# Patient Record
Sex: Female | Born: 1970 | Hispanic: Yes | Smoking: Never smoker
Health system: Southern US, Community
[De-identification: ages and names within clinical notes are randomized; demographics above are authoritative.]

## PROBLEM LIST (undated history)

## (undated) DIAGNOSIS — F329 Major depressive disorder, single episode, unspecified: Secondary | ICD-10-CM

## (undated) DIAGNOSIS — F32A Depression, unspecified: Secondary | ICD-10-CM

## (undated) DIAGNOSIS — F419 Anxiety disorder, unspecified: Secondary | ICD-10-CM

## (undated) DIAGNOSIS — G47419 Narcolepsy without cataplexy: Secondary | ICD-10-CM

## (undated) DIAGNOSIS — F909 Attention-deficit hyperactivity disorder, unspecified type: Secondary | ICD-10-CM

## (undated) HISTORY — PX: COCHLEAR IMPLANT: SUR684

## (undated) HISTORY — DX: Attention-deficit hyperactivity disorder, unspecified type: F90.9

## (undated) HISTORY — DX: Depression, unspecified: F32.A

## (undated) HISTORY — DX: Anxiety disorder, unspecified: F41.9

## (undated) HISTORY — DX: Major depressive disorder, single episode, unspecified: F32.9

## (undated) HISTORY — PX: BREAST BIOPSY: SHX20

---

## 2005-12-28 ENCOUNTER — Encounter: Admission: RE | Admit: 2005-12-28 | Discharge: 2005-12-28 | Payer: Self-pay | Admitting: Obstetrics & Gynecology

## 2006-01-02 ENCOUNTER — Encounter: Admission: RE | Admit: 2006-01-02 | Discharge: 2006-01-02 | Payer: Self-pay | Admitting: Interventional Radiology

## 2008-05-23 ENCOUNTER — Emergency Department (HOSPITAL_BASED_OUTPATIENT_CLINIC_OR_DEPARTMENT_OTHER): Admission: EM | Admit: 2008-05-23 | Discharge: 2008-05-23 | Payer: Self-pay | Admitting: Emergency Medicine

## 2008-05-23 ENCOUNTER — Ambulatory Visit: Payer: Self-pay | Admitting: Diagnostic Radiology

## 2009-07-07 ENCOUNTER — Emergency Department (HOSPITAL_BASED_OUTPATIENT_CLINIC_OR_DEPARTMENT_OTHER): Admission: EM | Admit: 2009-07-07 | Discharge: 2009-07-07 | Payer: Self-pay | Admitting: Emergency Medicine

## 2009-07-07 ENCOUNTER — Ambulatory Visit: Payer: Self-pay | Admitting: Diagnostic Radiology

## 2010-09-04 LAB — BASIC METABOLIC PANEL
Calcium: 8.6 mg/dL (ref 8.4–10.5)
Chloride: 105 mEq/L (ref 96–112)
Creatinine, Ser: 0.7 mg/dL (ref 0.4–1.2)
GFR calc Af Amer: >60 mL/min (ref >60)

## 2010-09-04 LAB — POCT CARDIAC MARKERS
Myoglobin, poc: 30 ng/mL (ref 12–200)
Troponin i, poc: <0.05 ng/mL (ref 0.00–0.09)

## 2010-09-04 LAB — CBC
MCHC: 34.3 g/dL (ref 30.0–36.0)
Platelets: 169 10*3/uL (ref 150–400)
RBC: 3.97 MIL/uL (ref 3.87–5.11)
RDW: 12.7 % (ref 11.5–15.5)
WBC: 3.9 10*3/uL — ABNORMAL LOW (ref 4.0–10.5)

## 2010-09-04 LAB — DIFFERENTIAL
Basophils Absolute: 0 10*3/uL (ref 0.0–0.1)
Eosinophils Absolute: 0.1 10*3/uL (ref 0.0–0.7)
Eosinophils Relative: 1 % (ref 0–5)
Lymphs Abs: 1.3 10*3/uL (ref 0.7–4.0)
Monocytes Relative: 7 % (ref 3–12)
Neutro Abs: 2.2 10*3/uL (ref 1.7–7.7)
Neutrophils Relative %: 57 % (ref 43–77)

## 2011-03-23 ENCOUNTER — Other Ambulatory Visit: Payer: Self-pay | Admitting: Obstetrics & Gynecology

## 2011-03-24 LAB — CBC
MCHC: 33.7 g/dL (ref 30.0–36.0)
RBC: 4.25 MIL/uL (ref 3.87–5.11)
RDW: 12.7 % (ref 11.5–15.5)
WBC: 4.1 10*3/uL (ref 4.0–10.5)

## 2011-03-24 LAB — DIFFERENTIAL
Basophils Absolute: 0 10*3/uL (ref 0.0–0.1)
Basophils Relative: 1 % (ref 0–1)
Eosinophils Absolute: 0.1 10*3/uL (ref 0.0–0.7)
Eosinophils Relative: 2 % (ref 0–5)
Monocytes Absolute: 0.2 10*3/uL (ref 0.1–1.0)
Monocytes Relative: 6 % (ref 3–12)
Neutrophils Relative %: 58 % (ref 43–77)

## 2011-03-24 LAB — COMPREHENSIVE METABOLIC PANEL
AST: 22 U/L (ref 0–37)
Albumin: 4.4 g/dL (ref 3.5–5.2)
BUN: 11 mg/dL (ref 6–23)
Creatinine, Ser: 0.7 mg/dL (ref 0.4–1.2)
Glucose, Bld: 77 mg/dL (ref 70–99)
Potassium: 3.8 mEq/L (ref 3.5–5.1)
Sodium: 142 mEq/L (ref 135–145)
Total Bilirubin: 0.7 mg/dL (ref 0.3–1.2)
Total Protein: 7.3 g/dL (ref 6.0–8.3)

## 2011-03-24 LAB — POCT CARDIAC MARKERS

## 2012-05-17 ENCOUNTER — Emergency Department (HOSPITAL_BASED_OUTPATIENT_CLINIC_OR_DEPARTMENT_OTHER)

## 2012-05-17 ENCOUNTER — Encounter (HOSPITAL_BASED_OUTPATIENT_CLINIC_OR_DEPARTMENT_OTHER): Payer: Self-pay | Admitting: *Deleted

## 2012-05-17 ENCOUNTER — Emergency Department (HOSPITAL_BASED_OUTPATIENT_CLINIC_OR_DEPARTMENT_OTHER)
Admission: EM | Admit: 2012-05-17 | Discharge: 2012-05-17 | Disposition: A | Discharge status: Home / Self Care | Attending: Emergency Medicine | Admitting: Emergency Medicine

## 2012-05-17 DIAGNOSIS — G47419 Narcolepsy without cataplexy: Secondary | ICD-10-CM | POA: Insufficient documentation

## 2012-05-17 DIAGNOSIS — R059 Cough, unspecified: Secondary | ICD-10-CM

## 2012-05-17 DIAGNOSIS — R05 Cough: Secondary | ICD-10-CM

## 2012-05-17 DIAGNOSIS — Z79899 Other long term (current) drug therapy: Secondary | ICD-10-CM | POA: Insufficient documentation

## 2012-05-17 DIAGNOSIS — J040 Acute laryngitis: Secondary | ICD-10-CM

## 2012-05-17 DIAGNOSIS — R509 Fever, unspecified: Secondary | ICD-10-CM | POA: Insufficient documentation

## 2012-05-17 HISTORY — DX: Narcolepsy without cataplexy: G47.419

## 2012-05-17 MED ORDER — BENZONATATE 100 MG PO CAPS: 100.0000 mg | ORAL_CAPSULE | Freq: Three times a day (TID) | ORAL | Status: DC

## 2012-05-17 NOTE — ED Notes (Addendum)
Pt c/o cough, SOB and "tickle in her throat" x 3 days, worse at night. Pt also c/o slight fever. Pt sts cough has been productive of green sputum.

## 2012-05-17 NOTE — ED Provider Notes (Signed)
History     CSN: [id number]  Arrival date & time 05/17/12  6962   First MD Initiated Contact with Patient 05/17/12 [phone]      Chief Complaint  Patient presents with  . Cough    (Consider location/radiation/quality/duration/timing/severity/associated sxs/prior treatment) HPI Pt presents with c/o cough and laryngitis.  Symptoms began several days ago.  Low grade fever.  Pt states she feels a tickle in her throat and it makes her cough.  Cough is nonproductive.  No vomiting.  Was seen by her PMD and given rx for albuterol inhaler and prednisone which has given some relief.  She has been having difficulty sleeping due to the cough.  There are no other associated systemic symptoms, there are no other alleviating or modifying factors.   Past Medical History  Diagnosis Date  . Narcolepsy     Past Surgical History  Procedure Date  . Bladder repair w/ cesarean section     No family history on file.  History  Substance Use Topics  . Smoking status: Never Smoker   . Smokeless tobacco: Not on file  . Alcohol Use: Yes    OB History    Grav Para Term Preterm Abortions TAB SAB Ect Mult Living                  Review of Systems ROS reviewed and all otherwise negative except for mentioned in HPI  Allergies  Ibuprofen and Latex  Home Medications   Current Outpatient Rx  Name  Route  Sig  Dispense  Refill  . ALBUTEROL SULFATE HFA 108 (90 BASE) MCG/ACT IN AERS   Inhalation   Inhale 2 puffs into the lungs every 6 (six) hours as needed.         Marland Kitchen PREDNISONE 10 MG PO TABS   Oral   Take 10 mg by mouth daily.         Marland Kitchen BENZONATATE 100 MG PO CAPS   Oral   Take 1 capsule (100 mg total) by mouth every 8 (eight) hours.   21 capsule   0     BP 126/72  Pulse 73  Temp 98.6 F (37 C) (Oral)  Resp 18  Ht 5\' 2"  (1.575 m)  Wt 150 lb (68.04 kg)  BMI 27.44 kg/m2  SpO2 100%  LMP 05/14/2012 Vitals reviewed Physical Exam Physical Examination: General appearance - alert,  well appearing, and in no distress Mental status - alert, oriented to person, place, and time Eyes - no conjunctival injection, no scleral icterus Mouth - mucous membranes moist, pharynx normal without lesions Chest - clear to auscultation, no wheezes, rales or rhonchi, symmetric air entry Heart - normal rate, regular rhythm, normal S1, S2, no murmurs, rubs, clicks or gallops Abdomen - soft, nontender, nondistended, no masses or organomegaly Extremities - peripheral pulses normal, no pedal edema, no clubbing or cyanosis Skin - normal coloration and turgor, no rashes  ED Course  Procedures (including critical care time)  Labs Reviewed - No data to display Dg Chest 2 View  05/17/2012  *RADIOLOGY REPORT*  Clinical Data: Cough, congestion  CHEST - 2 VIEW  Comparison: 07/07/2009  Findings: Lungs are clear. No pleural effusion or pneumothorax.  Cardiomediastinal silhouette is within normal limits.  Visualized osseous structures are within normal limits.  IMPRESSION: No evidence of acute cardiopulmonary disease.   Original Report Authenticated By: Charline Bills, M.D.      1. Cough   2. Laryngitis       MDM  Pt presenting with c/o cough and laryngitis.  CXR reassuring- images reviewed by me as well.  Pt is overall nontoxic and well hydrated in appearance.  Taking albuterol and prednisone as prescribed.  Will give rx for tessalon to help with cough.   Discharged with strict return precautions.  Pt agreeable with plan.        Ethelda Chick, MD 05/17/12 [phone]

## 2013-09-15 ENCOUNTER — Other Ambulatory Visit (HOSPITAL_BASED_OUTPATIENT_CLINIC_OR_DEPARTMENT_OTHER): Payer: Self-pay | Admitting: Nurse Practitioner

## 2013-09-15 DIAGNOSIS — Z1231 Encounter for screening mammogram for malignant neoplasm of breast: Secondary | ICD-10-CM

## 2013-09-22 ENCOUNTER — Ambulatory Visit (HOSPITAL_BASED_OUTPATIENT_CLINIC_OR_DEPARTMENT_OTHER)
Admission: RE | Admit: 2013-09-22 | Discharge: 2013-09-22 | Disposition: A | Discharge status: Home / Self Care | Payer: BC Managed Care – PPO | Source: Ambulatory Visit | Attending: Nurse Practitioner | Admitting: Nurse Practitioner

## 2013-09-22 DIAGNOSIS — Z1231 Encounter for screening mammogram for malignant neoplasm of breast: Secondary | ICD-10-CM | POA: Insufficient documentation

## 2013-11-17 ENCOUNTER — Ambulatory Visit: Payer: Self-pay | Admitting: Obstetrics & Gynecology

## 2014-06-15 ENCOUNTER — Encounter: Payer: Self-pay | Admitting: *Deleted

## 2014-06-16 ENCOUNTER — Encounter: Payer: Self-pay | Admitting: Obstetrics & Gynecology

## 2015-03-29 ENCOUNTER — Other Ambulatory Visit (HOSPITAL_BASED_OUTPATIENT_CLINIC_OR_DEPARTMENT_OTHER): Payer: Self-pay | Admitting: Family Medicine

## 2015-03-29 DIAGNOSIS — Z1231 Encounter for screening mammogram for malignant neoplasm of breast: Secondary | ICD-10-CM

## 2015-04-06 ENCOUNTER — Ambulatory Visit (HOSPITAL_BASED_OUTPATIENT_CLINIC_OR_DEPARTMENT_OTHER)
Admission: RE | Admit: 2015-04-06 | Discharge: 2015-04-06 | Disposition: A | Discharge status: Home / Self Care | Payer: No Typology Code available for payment source | Source: Ambulatory Visit | Attending: Family Medicine | Admitting: Family Medicine

## 2015-04-06 ENCOUNTER — Other Ambulatory Visit (HOSPITAL_BASED_OUTPATIENT_CLINIC_OR_DEPARTMENT_OTHER): Payer: Self-pay | Admitting: Family Medicine

## 2015-04-06 DIAGNOSIS — Z1231 Encounter for screening mammogram for malignant neoplasm of breast: Secondary | ICD-10-CM | POA: Diagnosis present

## 2016-05-18 ENCOUNTER — Other Ambulatory Visit (HOSPITAL_BASED_OUTPATIENT_CLINIC_OR_DEPARTMENT_OTHER): Payer: Self-pay | Admitting: Internal Medicine

## 2016-05-18 DIAGNOSIS — Z1231 Encounter for screening mammogram for malignant neoplasm of breast: Secondary | ICD-10-CM

## 2016-05-23 ENCOUNTER — Other Ambulatory Visit (HOSPITAL_BASED_OUTPATIENT_CLINIC_OR_DEPARTMENT_OTHER): Payer: Self-pay | Admitting: Internal Medicine

## 2016-05-23 ENCOUNTER — Encounter (HOSPITAL_BASED_OUTPATIENT_CLINIC_OR_DEPARTMENT_OTHER): Payer: Self-pay | Admitting: Radiology

## 2016-05-23 ENCOUNTER — Ambulatory Visit (HOSPITAL_BASED_OUTPATIENT_CLINIC_OR_DEPARTMENT_OTHER)
Admission: RE | Admit: 2016-05-23 | Discharge: 2016-05-23 | Disposition: A | Discharge status: Home / Self Care | Payer: BLUE CROSS/BLUE SHIELD | Source: Ambulatory Visit | Attending: Internal Medicine | Admitting: Internal Medicine

## 2016-05-23 DIAGNOSIS — Z1231 Encounter for screening mammogram for malignant neoplasm of breast: Secondary | ICD-10-CM

## 2017-05-08 ENCOUNTER — Other Ambulatory Visit (HOSPITAL_BASED_OUTPATIENT_CLINIC_OR_DEPARTMENT_OTHER): Payer: Self-pay | Admitting: Internal Medicine

## 2017-05-08 DIAGNOSIS — Z1231 Encounter for screening mammogram for malignant neoplasm of breast: Secondary | ICD-10-CM

## 2017-05-24 ENCOUNTER — Other Ambulatory Visit (HOSPITAL_BASED_OUTPATIENT_CLINIC_OR_DEPARTMENT_OTHER): Payer: Self-pay | Admitting: Internal Medicine

## 2017-05-24 ENCOUNTER — Ambulatory Visit (HOSPITAL_BASED_OUTPATIENT_CLINIC_OR_DEPARTMENT_OTHER)
Admission: RE | Admit: 2017-05-24 | Discharge: 2017-05-24 | Disposition: A | Discharge status: Home / Self Care | Payer: BLUE CROSS/BLUE SHIELD | Source: Ambulatory Visit | Attending: Internal Medicine | Admitting: Internal Medicine

## 2017-05-24 DIAGNOSIS — Z1231 Encounter for screening mammogram for malignant neoplasm of breast: Secondary | ICD-10-CM

## 2017-05-25 ENCOUNTER — Other Ambulatory Visit: Payer: Self-pay | Admitting: Internal Medicine

## 2017-05-25 DIAGNOSIS — R928 Other abnormal and inconclusive findings on diagnostic imaging of breast: Secondary | ICD-10-CM

## 2017-06-07 ENCOUNTER — Ambulatory Visit
Admission: RE | Admit: 2017-06-07 | Discharge: 2017-06-07 | Disposition: A | Discharge status: Home / Self Care | Payer: No Typology Code available for payment source | Source: Ambulatory Visit | Attending: Internal Medicine | Admitting: Internal Medicine

## 2017-06-07 ENCOUNTER — Other Ambulatory Visit: Payer: Self-pay | Admitting: Internal Medicine

## 2017-06-07 DIAGNOSIS — R928 Other abnormal and inconclusive findings on diagnostic imaging of breast: Secondary | ICD-10-CM

## 2017-06-14 ENCOUNTER — Ambulatory Visit
Admission: RE | Admit: 2017-06-14 | Discharge: 2017-06-14 | Disposition: A | Discharge status: Home / Self Care | Payer: BLUE CROSS/BLUE SHIELD | Source: Ambulatory Visit | Attending: Internal Medicine | Admitting: Internal Medicine

## 2017-06-14 ENCOUNTER — Other Ambulatory Visit: Payer: Self-pay | Admitting: Internal Medicine

## 2017-06-14 DIAGNOSIS — R928 Other abnormal and inconclusive findings on diagnostic imaging of breast: Secondary | ICD-10-CM

## 2018-01-30 ENCOUNTER — Encounter: Payer: Self-pay | Admitting: Obstetrics & Gynecology

## 2018-01-30 ENCOUNTER — Ambulatory Visit (INDEPENDENT_AMBULATORY_CARE_PROVIDER_SITE_OTHER): Payer: BLUE CROSS/BLUE SHIELD | Admitting: Obstetrics & Gynecology

## 2018-01-30 VITALS — BP 120/68 | HR 80 | Resp 16 | Ht 62.0 in | Wt 171.0 lb

## 2018-01-30 DIAGNOSIS — Z01419 Encounter for gynecological examination (general) (routine) without abnormal findings: Secondary | ICD-10-CM

## 2018-01-30 DIAGNOSIS — Z1151 Encounter for screening for human papillomavirus (HPV): Secondary | ICD-10-CM | POA: Diagnosis not present

## 2018-01-30 DIAGNOSIS — Z124 Encounter for screening for malignant neoplasm of cervix: Secondary | ICD-10-CM

## 2018-01-30 DIAGNOSIS — N852 Hypertrophy of uterus: Secondary | ICD-10-CM

## 2018-01-30 NOTE — Progress Notes (Signed)
Subjective:     Deanna Mcdonald is a 47 y.o. female here for a routine exam. LMP Jul- spotting. [id number] SAB x2 EAB x1 SVDx2 c-section x 3.  Current complaints: pt reports irreg bleeding. She repots periodic pain and discharge and constipation with a 'heaviness' she reports feeling like 'stuff shifted'.  Pt feels abd bloating.    Gynecologic History Patient's last menstrual period was 01/07/2018. Contraception: IUD (mirena) 2nd- placed 2014 Last Pap: 1 yer prev. Results were: normal last abnormal PAP was 2011- repeated WNL   Last mammogram: 05/2017 Results were: abnormal- suspicious. S/p bx- benign.  12/27/20189 Diagnosis Breast, left, needle core biopsy, 6:30 o'clock - BENIGN BREAST TISSUE. - SEE COMMENT. - NO MALIGNANCY IDENTIFIED.  Obstetric History OB History  Gravida Para Term Preterm AB Living  8 5 5  0 3 5  SAB TAB Ectopic Multiple Live Births  2 1 0 0 0    # Outcome Date GA Lbr Len/2nd Weight Sex Delivery Anes PTL Lv  8 TAB           7 SAB           6 SAB           5 Term           4 Term           3 Term           2 Term           1 Term            The following portions of the patient's history were reviewed and updated as appropriate: allergies, current medications, past family history, past medical history, past social history, past surgical history and problem list.  Review of Systems Pertinent items are noted in HPI.    Objective:  BP 120/68   Pulse 80   Resp 16   Ht 5\' 2"  (1.575 m)   Wt 171 lb (77.6 kg)   LMP 01/07/2018   BMI 31.28 kg/m   General Appearance:    Alert, cooperative, no distress, appears stated age  Head:    Normocephalic, without obvious abnormality, atraumatic  Eyes:    conjunctiva/corneas clear, EOM's intact, both eyes  Ears:    Normal external ear canals, both ears  Nose:   Nares normal, septum midline, mucosa normal, no drainage    or sinus tenderness  Throat:   Lips, mucosa, and tongue normal; teeth and gums normal  Neck:    Supple, symmetrical, trachea midline, no adenopathy;    thyroid:  no enlargement/tenderness/nodules  Back:     Symmetric, no curvature, ROM normal, no CVA tenderness  Lungs:     Clear to auscultation bilaterally, respirations unlabored  Chest Wall:    No tenderness or deformity   Heart:    Regular rate and rhythm, S1 and S2 normal, no murmur, rub   or gallop  Breast Exam:    No tenderness, masses, or nipple abnormality  Abdomen:     Soft, non-tender, bowel sounds active all four quadrants,    no masses, no organomegaly. Mass palpated below umbilicus.   Genitalia:    Normal female without lesion, discharge or tenderness   Uterus enlarged 16 weeks size; mobile; no adnexal masses      Extremities:   Extremities normal, atraumatic, no cyanosis or edema  Pulses:   2+ and symmetric all extremities  Skin:   Skin color, texture, turgor normal, no rashes or lesions  Assessment:    Healthy female exam.   enlarged uterus- suspect fibroids (after visit pt reported that she does have a h/o uterine fibroids)   Plan:    Follow up in: 2 weeks.    F/u PAP with hrHPV Pelvic US  Deanna Mcdonald L. Harraway-Smith, M.D., [name]

## 2018-01-30 NOTE — Patient Instructions (Signed)
Uterine Fibroids Uterine fibroids are tissue masses (tumors) that can develop in the womb (uterus). They are also called leiomyomas. This type of tumor is not cancerous (benign) and does not spread to other parts of the body outside of the pelvic area, which is between the hip bones. Occasionally, fibroids may develop in the fallopian tubes, in the cervix, or on the support structures (ligaments) that surround the uterus. You can have one or many fibroids. Fibroids can vary in size, weight, and where they grow in the uterus. Some can become quite large. Most fibroids do not require medical treatment. What are the causes? A fibroid can develop when a single uterine cell keeps growing (replicating). Most cells in the human body have a control mechanism that keeps them from replicating without control. What are the signs or symptoms? Symptoms may include:  Heavy bleeding during your period.  Bleeding or spotting between periods.  Pelvic pain and pressure.  Bladder problems, such as needing to urinate more often (urinary frequency) or urgently.  Inability to reproduce offspring (infertility).  Miscarriages.  How is this diagnosed? Uterine fibroids are diagnosed through a physical exam. Your health care provider may feel the lumpy tumors during a pelvic exam. Ultrasonography and an MRI may be done to determine the size, location, and number of fibroids. How is this treated? Treatment may include:  Watchful waiting. This involves getting the fibroid checked by your health care provider to see if it grows or shrinks. Follow your health care provider's recommendations for how often to have this checked.  Hormone medicines. These can be taken by mouth or given through an intrauterine device (IUD).  Surgery. ? Removing the fibroids (myomectomy) or the uterus (hysterectomy). ? Removing blood supply to the fibroids (uterine artery embolization).  If fibroids interfere with your fertility and you  want to become pregnant, your health care provider may recommend having the fibroids removed. Follow these instructions at home:  Keep all follow-up visits as directed by your health care provider. This is important.  Take over-the-counter and prescription medicines only as told by your health care provider. ? If you were prescribed a hormone treatment, take the hormone medicines exactly as directed.  Ask your health care provider about taking iron pills and increasing the amount of dark green, leafy vegetables in your diet. These actions can help to boost your blood iron levels, which may be affected by heavy menstrual bleeding.  Pay close attention to your period and tell your health care provider about any changes, such as: ? Increased blood flow that requires you to use more pads or tampons than usual per month. ? A change in the number of days that your period lasts per month. ? A change in symptoms that are associated with your period, such as abdominal cramping or back pain. Contact a health care provider if:  You have pelvic pain, back pain, or abdominal cramps that cannot be controlled with medicines.  You have an increase in bleeding between and during periods.  You soak tampons or pads in a half hour or less.  You feel lightheaded, extra tired, or weak. Get help right away if:  You faint.  You have a sudden increase in pelvic pain. This information is not intended to replace advice given to you by your health care provider. Make sure you discuss any questions you have with your health care provider. Document Released: 06/02/2000 Document Revised: 02/03/2016 Document Reviewed: 12/02/2013 Elsevier Interactive Patient Education  2018 Elsevier Inc[employer]  

## 2018-02-01 ENCOUNTER — Other Ambulatory Visit (HOSPITAL_BASED_OUTPATIENT_CLINIC_OR_DEPARTMENT_OTHER): Payer: BLUE CROSS/BLUE SHIELD

## 2018-02-01 LAB — CYTOLOGY - PAP
DIAGNOSIS: NEGATIVE
HPV (WINDOPATH): NOT DETECTED

## 2018-02-05 ENCOUNTER — Ambulatory Visit (HOSPITAL_BASED_OUTPATIENT_CLINIC_OR_DEPARTMENT_OTHER): Payer: BLUE CROSS/BLUE SHIELD

## 2018-02-05 ENCOUNTER — Other Ambulatory Visit: Payer: Self-pay

## 2018-02-05 DIAGNOSIS — N852 Hypertrophy of uterus: Secondary | ICD-10-CM

## 2018-02-06 ENCOUNTER — Other Ambulatory Visit: Payer: Self-pay

## 2018-02-06 DIAGNOSIS — N852 Hypertrophy of uterus: Secondary | ICD-10-CM

## 2018-02-09 ENCOUNTER — Ambulatory Visit (HOSPITAL_BASED_OUTPATIENT_CLINIC_OR_DEPARTMENT_OTHER)
Admission: RE | Admit: 2018-02-09 | Discharge: 2018-02-09 | Disposition: A | Discharge status: Home / Self Care | Payer: BLUE CROSS/BLUE SHIELD | Source: Ambulatory Visit | Attending: Obstetrics & Gynecology | Admitting: Obstetrics & Gynecology

## 2018-02-09 DIAGNOSIS — N852 Hypertrophy of uterus: Secondary | ICD-10-CM

## 2018-02-09 DIAGNOSIS — D259 Leiomyoma of uterus, unspecified: Secondary | ICD-10-CM | POA: Insufficient documentation

## 2018-02-09 DIAGNOSIS — Z975 Presence of (intrauterine) contraceptive device: Secondary | ICD-10-CM | POA: Diagnosis not present

## 2018-02-14 ENCOUNTER — Encounter: Payer: Self-pay | Admitting: Obstetrics & Gynecology

## 2018-02-14 ENCOUNTER — Ambulatory Visit (INDEPENDENT_AMBULATORY_CARE_PROVIDER_SITE_OTHER): Payer: BLUE CROSS/BLUE SHIELD | Admitting: Obstetrics & Gynecology

## 2018-02-14 VITALS — BP 108/58 | HR 75 | Ht 62.0 in | Wt 172.0 lb

## 2018-02-14 DIAGNOSIS — D219 Benign neoplasm of connective and other soft tissue, unspecified: Secondary | ICD-10-CM

## 2018-02-14 NOTE — Progress Notes (Addendum)
History:  47 y.o. [id number] here today for f/u of [place]. Pt has a LnIUD that was placed in 2014 or 2015. Pt has the card at home. Pt thinks its been less than 5 years. She reports occ spotting in the past few months. Prior to that, pt had amenorrhea due to the [place]. She denies pain.      The following portions of the patient's history were reviewed and updated as appropriate: allergies, current medications, past family history, past medical history, past social history, past surgical history and problem list.  Review of Systems:  Pertinent items are noted in HPI.    Objective:  Physical Exam Blood pressure (!) 108/58, pulse 75, height 5\' 2"  (1.575 m), weight 172 lb (78 kg), last menstrual period 01/07/2018.  CONSTITUTIONAL: Well-developed, well-nourished female in no acute distress.  HENT:  Normocephalic, atraumatic EYES: Conjunctivae and EOM are normal. No scleral icterus.  NECK: Normal range of motion SKIN: Skin is warm and dry. No rash noted. Not diaphoretic.No pallor. [place]: Alert and oriented to person, place, and time. Normal coordination.  GYN: exam deferred  Labs and Imaging US Pelvis Transvanginal Non-ob (tv Only)  Result Date: 02/09/2018 CLINICAL DATA:  Pelvic discomfort, bloating, and constipation for 8 months. History of fibroids and adenomyosis seen on an MRI from July 2007. Patient has IUD. EXAM: TRANSABDOMINAL AND TRANSVAGINAL ULTRASOUND OF PELVIS TECHNIQUE: Both transabdominal and transvaginal ultrasound examinations of the pelvis were performed. Transabdominal technique was performed for global imaging of the pelvis including uterus, ovaries, adnexal regions, and pelvic cul-de-sac. It was necessary to proceed with endovaginal exam following the transabdominal exam to visualize the endometrium and ovaries. COMPARISON:  None FINDINGS: Uterus Measurements: 14.1 x 7.7 x 8.7 cm. Multiple fibroids are identified. The 3 largest fibroids were measured. The largest in the anterior  fundus measures 2.3 x 1.6 x 2.3 cm. The second to the left-sided the fundus measures 2.7 x 2.4 x 2.7 cm. The third in the uterine body to the left measures 2.8 x 1.4 x 2.2 cm. Endometrium Thickness: 9 mm. An IUD is identified in the uterine body and fundus. Right ovary Measurements: 5.1 x 3.4 x 4 cm. Contains 2 simple cysts measuring 3.6 x 2.7 x 3.3 cm in 2.3 x 2.3 x 2.1 cm Left ovary Measurements: 3.7 x 3.1 x 3.2 cm. Normal appearance/no adnexal mass. Other findings No abnormal free fluid. IMPRESSION: 1. Enlarged fibroid uterus as above. The largest 3 fibroids were measured. 2. The IUD is at the level of the uterine body and fundus. 3. Two dominant follicles in the right ovary. Electronically Signed   By: Dorise Bullion III M.D   On: 02/09/2018 14:51   US Pelvis Complete  Result Date: 02/09/2018 CLINICAL DATA:  Pelvic discomfort, bloating, and constipation for 8 months. History of fibroids and adenomyosis seen on an MRI from July 2007. Patient has IUD. EXAM: TRANSABDOMINAL AND TRANSVAGINAL ULTRASOUND OF PELVIS TECHNIQUE: Both transabdominal and transvaginal ultrasound examinations of the pelvis were performed. Transabdominal technique was performed for global imaging of the pelvis including uterus, ovaries, adnexal regions, and pelvic cul-de-sac. It was necessary to proceed with endovaginal exam following the transabdominal exam to visualize the endometrium and ovaries. COMPARISON:  None FINDINGS: Uterus Measurements: 14.1 x 7.7 x 8.7 cm. Multiple fibroids are identified. The 3 largest fibroids were measured. The largest in the anterior fundus measures 2.3 x 1.6 x 2.3 cm. The second to the left-sided the fundus measures 2.7 x 2.4 x 2.7 cm. The third in  the uterine body to the left measures 2.8 x 1.4 x 2.2 cm. Endometrium Thickness: 9 mm. An IUD is identified in the uterine body and fundus. Right ovary Measurements: 5.1 x 3.4 x 4 cm. Contains 2 simple cysts measuring 3.6 x 2.7 x 3.3 cm in 2.3 x 2.3 x 2.1 cm  Left ovary Measurements: 3.7 x 3.1 x 3.2 cm. Normal appearance/no adnexal mass. Other findings No abnormal free fluid. IMPRESSION: 1. Enlarged fibroid uterus as above. The largest 3 fibroids were measured. 2. The IUD is at the level of the uterine body and fundus. 3. Two dominant follicles in the right ovary. Electronically Signed   By: Dorise Bullion III M.D   On: 02/09/2018 14:51    Assessment & Plan:  Fibroids- asymptomatic Reviewed results of [place]   Pt will check to see when her IUD needs to be changed and will f/iu of removal and replacement I reviewed fibroids and related sx and answered all questions related to etiology, sx and management.    Total face-to-face time with patient was 15 min.  Greater than 50% was spent in counseling and coordination of care with the patient.   Takari Duncombe L. Harraway-Smith, M.D., [name]

## 2018-02-14 NOTE — Patient Instructions (Signed)
Uterine Fibroids Uterine fibroids are tissue masses (tumors) that can develop in the womb (uterus). They are also called leiomyomas. This type of tumor is not cancerous (benign) and does not spread to other parts of the body outside of the pelvic area, which is between the hip bones. Occasionally, fibroids may develop in the fallopian tubes, in the cervix, or on the support structures (ligaments) that surround the uterus. You can have one or many fibroids. Fibroids can vary in size, weight, and where they grow in the uterus. Some can become quite large. Most fibroids do not require medical treatment. What are the causes? A fibroid can develop when a single uterine cell keeps growing (replicating). Most cells in the human body have a control mechanism that keeps them from replicating without control. What are the signs or symptoms? Symptoms may include:  Heavy bleeding during your period.  Bleeding or spotting between periods.  Pelvic pain and pressure.  Bladder problems, such as needing to urinate more often (urinary frequency) or urgently.  Inability to reproduce offspring (infertility).  Miscarriages.  How is this diagnosed? Uterine fibroids are diagnosed through a physical exam. Your health care provider may feel the lumpy tumors during a pelvic exam. Ultrasonography and an MRI may be done to determine the size, location, and number of fibroids. How is this treated? Treatment may include:  Watchful waiting. This involves getting the fibroid checked by your health care provider to see if it grows or shrinks. Follow your health care provider's recommendations for how often to have this checked.  Hormone medicines. These can be taken by mouth or given through an intrauterine device (IUD).  Surgery. ? Removing the fibroids (myomectomy) or the uterus (hysterectomy). ? Removing blood supply to the fibroids (uterine artery embolization).  If fibroids interfere with your fertility and you  want to become pregnant, your health care provider may recommend having the fibroids removed. Follow these instructions at home:  Keep all follow-up visits as directed by your health care provider. This is important.  Take over-the-counter and prescription medicines only as told by your health care provider. ? If you were prescribed a hormone treatment, take the hormone medicines exactly as directed.  Ask your health care provider about taking iron pills and increasing the amount of dark green, leafy vegetables in your diet. These actions can help to boost your blood iron levels, which may be affected by heavy menstrual bleeding.  Pay close attention to your period and tell your health care provider about any changes, such as: ? Increased blood flow that requires you to use more pads or tampons than usual per month. ? A change in the number of days that your period lasts per month. ? A change in symptoms that are associated with your period, such as abdominal cramping or back pain. Contact a health care provider if:  You have pelvic pain, back pain, or abdominal cramps that cannot be controlled with medicines.  You have an increase in bleeding between and during periods.  You soak tampons or pads in a half hour or less.  You feel lightheaded, extra tired, or weak. Get help right away if:  You faint.  You have a sudden increase in pelvic pain. This information is not intended to replace advice given to you by your health care provider. Make sure you discuss any questions you have with your health care provider. Document Released: 06/02/2000 Document Revised: 02/03/2016 Document Reviewed: 12/02/2013 Elsevier Interactive Patient Education  2018 Elsevier Inc[employer]  

## 2018-02-14 NOTE — Progress Notes (Signed)
Pt here to go over U/S results

## 2018-04-15 ENCOUNTER — Encounter: Payer: Self-pay | Admitting: Obstetrics & Gynecology

## 2018-04-15 ENCOUNTER — Ambulatory Visit (INDEPENDENT_AMBULATORY_CARE_PROVIDER_SITE_OTHER): Payer: BLUE CROSS/BLUE SHIELD | Admitting: Obstetrics & Gynecology

## 2018-04-15 VITALS — BP 122/64 | HR 78 | Ht 62.0 in | Wt 175.0 lb

## 2018-04-15 DIAGNOSIS — Z30432 Encounter for removal of intrauterine contraceptive device: Secondary | ICD-10-CM

## 2018-04-15 DIAGNOSIS — N9089 Other specified noninflammatory disorders of vulva and perineum: Secondary | ICD-10-CM

## 2018-04-15 NOTE — Progress Notes (Signed)
Patient interested in having IUD removed. Patient states IUD was placed in 2012. Kathrene Alu RN

## 2018-04-15 NOTE — Progress Notes (Signed)
Pt wants IUD removed. It was placed in 2012.  She also has a BTL .  Patient was in the dorsal lithotomy position, normal external genitalia was noted.  A speculum was placed in the patient's vagina, normal discharge was noted, no lesions. The multiparous cervix was visualized, no lesions, no abnormal discharge;  and the cervix was swabbed with Betadine using scopettes. The strings of the IUD were not visualized, so Kelly forceps were introduced into the endometrial cavity and the IUD was grasped and removed in its entirety.  Patient tolerated the procedure well.    Pt reports a vaginal skin tag that gets caught on clothes etc and she wants to discuss having it removed. GU on the right labia there is a long skin tag noted.   Will schedule pt for removal of vulvar skin tag  Trooper Olander L. Harraway-Smith, M.D., [name]

## 2018-05-20 ENCOUNTER — Encounter: Payer: Self-pay | Admitting: Obstetrics & Gynecology

## 2018-05-20 ENCOUNTER — Ambulatory Visit (INDEPENDENT_AMBULATORY_CARE_PROVIDER_SITE_OTHER): Payer: BLUE CROSS/BLUE SHIELD | Admitting: Obstetrics & Gynecology

## 2018-05-20 VITALS — BP 115/68 | HR 79 | Ht 62.0 in | Wt 176.0 lb

## 2018-05-20 DIAGNOSIS — N9089 Other specified noninflammatory disorders of vulva and perineum: Secondary | ICD-10-CM

## 2018-05-20 NOTE — Patient Instructions (Signed)
Skin Tag, Adult A skin tag (acrochordon) is a soft, extra growth of skin. Most skin tags are flesh-colored and rarely bigger than a pencil eraser. They commonly form near areas where there are folds in the skin, such as the armpit or groin. Skin tags are not dangerous, and they do not spread from person to person (are not contagious). You may have one skin tag or several. Skin tags do not require treatment. However, your health care provider may recommend removal of a skin tag if it:  Gets irritated from clothing.  Bleeds.  Is visible and unsightly.  Your health care provider can remove skin tags with a simple surgical procedure or a procedure that involves freezing the skin tag. Follow these instructions at home:  Watch for any changes in your skin tag. A normal skin tag does not require any other special care at home.  Take over-the-counter and prescription medicines only as told by your health care provider.  Keep all follow-up visits as told by your health care provider. This is important. Contact a health care provider if:  You have a skin tag that: ? Becomes painful. ? Changes color. ? Bleeds. ? Swells.  You develop more skin tags. This information is not intended to replace advice given to you by your health care provider. Make sure you discuss any questions you have with your health care provider. Document Released: 06/20/2015 Document Revised: 01/30/2016 Document Reviewed: 06/20/2015 Elsevier Interactive Patient Education  2018 Elsevier Inc.  

## 2018-05-20 NOTE — Progress Notes (Signed)
The indications and risks/complications of skin tag excision were reviewed.   Risks of the excision including pain, bleeding, infection, further scarring including recurrent keloid formation and need for additional procedures  were discussed.The patient stated understanding and agreed to undergo procedure today. Consent was signed,  time out performed.  The patient's right labia majus was cleaned with Betadine. 2% lidocaine WITHOUT epi was injected into the area 1.5cc.  Using a #15 blade the the skin tag was removed in its entirety.  The patient tolerated the procedure well. Post-procedure instructions  were given to the patient. The patient is to call for bleeding, fever greater than 100.4,  or other concerns. The patient will be return to clinic prn  Teigan Manner L. Harraway-Smith, M.D., [name]

## 2018-06-05 ENCOUNTER — Other Ambulatory Visit (HOSPITAL_BASED_OUTPATIENT_CLINIC_OR_DEPARTMENT_OTHER): Payer: Self-pay | Admitting: Internal Medicine

## 2018-06-05 DIAGNOSIS — Z1231 Encounter for screening mammogram for malignant neoplasm of breast: Secondary | ICD-10-CM

## 2018-06-10 ENCOUNTER — Encounter: Payer: Self-pay | Admitting: Obstetrics & Gynecology

## 2018-06-10 ENCOUNTER — Ambulatory Visit: Payer: BLUE CROSS/BLUE SHIELD | Admitting: Obstetrics & Gynecology

## 2018-06-10 VITALS — BP 116/65 | HR 80 | Ht 62.0 in | Wt 176.0 lb

## 2018-06-10 DIAGNOSIS — Z01812 Encounter for preprocedural laboratory examination: Secondary | ICD-10-CM

## 2018-06-10 DIAGNOSIS — Z30430 Encounter for insertion of intrauterine contraceptive device: Secondary | ICD-10-CM | POA: Diagnosis not present

## 2018-06-10 DIAGNOSIS — Z3202 Encounter for pregnancy test, result negative: Secondary | ICD-10-CM

## 2018-06-10 DIAGNOSIS — N920 Excessive and frequent menstruation with regular cycle: Secondary | ICD-10-CM

## 2018-06-10 LAB — POCT URINE PREGNANCY: PREG TEST UR: NEGATIVE

## 2018-06-10 MED ORDER — LEVONORGESTREL 20 MCG/24HR IU IUD
INTRAUTERINE_SYSTEM | Freq: Once | INTRAUTERINE | Status: AC
  Administered 2018-06-10: 11:00:00 1 via INTRAUTERINE

## 2018-06-10 NOTE — Progress Notes (Signed)
GYNECOLOGY CLINIC PROCEDURE NOTE  Deanna Mcdonald is a 47 y.o. [id number] here for Mirena IUD insertion.she has had a BTL but, wishes to have the [place] for control of menses. She had 1 1/2 cycles since the IUD was removed and she does not want to go through that again.   Last pap smear was on 01/30/2018 and was normal.  IUD Insertion Procedure Note Patient identified, informed consent performed.  Discussed risks of irregular bleeding, cramping, infection, malpositioning or misplacement of the IUD outside the uterus which may require further procedures. Time out was performed.  Urine pregnancy test negative.  Speculum placed in the vagina.  Cervix visualized.  Cleaned with Betadine x 2.  Grasped anteriorly with a single tooth tenaculum.  Uterus sounded to 9 cm.  Mirena IUD placed per manufacturer's recommendations.  Strings trimmed to 3 cm. Tenaculum was removed, good hemostasis noted.  Patient tolerated procedure well.   Patient was given post-procedure instructions.  Patient was asked to follow up in 4 weeks for IUD check.  Ahaan Zobrist L. Harraway-Smith, M.D., Deanna Mcdonald

## 2018-06-10 NOTE — Progress Notes (Signed)
Patient would like to have Mirena inserted. Kathrene Alu RN

## 2018-06-10 NOTE — Patient Instructions (Signed)
Intrauterine Device Insertion, Care After    This sheet gives you information about how to care for yourself after your procedure. Your health care provider may also give you more specific instructions. If you have problems or questions, contact your health care provider.  What can I expect after the procedure?  After the procedure, it is common to have:   Cramps and pain in the abdomen.   Light bleeding (spotting) or heavier bleeding that is like your menstrual period. This may last for up to a few days.   Lower back pain.   Dizziness.   Headaches.   Nausea.  Follow these instructions at home:   Before resuming sexual activity, check to make sure that you can feel the IUD string(s). You should be able to feel the end of the string(s) below the opening of your cervix. If your IUD string is in place, you may resume sexual activity.  ? If you had a hormonal IUD inserted more than 7 days after your most recent period started, you will need to use a backup method of birth control for 7 days after IUD insertion. Ask your health care provider whether this applies to you.   Continue to check that the IUD is still in place by feeling for the string(s) after every menstrual period, or once a month.   Take over-the-counter and prescription medicines only as told by your health care provider.   Do not drive or use heavy machinery while taking prescription pain medicine.   Keep all follow-up visits as told by your health care provider. This is important.  Contact a health care provider if:   You have bleeding that is heavier or lasts longer than a normal menstrual cycle.   You have a fever.   You have cramps or abdominal pain that get worse or do not get better with medicine.   You develop abdominal pain that is new or is not in the same area of earlier cramping and pain.   You feel lightheaded or weak.   You have abnormal or bad-smelling discharge from your vagina.   You have pain during sexual  activity.   You have any of the following problems with your IUD string(s):  ? The string bothers or hurts you or your sexual partner.  ? You cannot feel the string.  ? The string has gotten longer.   You can feel the IUD in your vagina.   You think you may be pregnant, or you miss your menstrual period.   You think you may have an STI (sexually transmitted infection).  Get help right away if:   You have flu-like symptoms.   You have a fever and chills.   You can feel that your IUD has slipped out of place.  Summary   After the procedure, it is common to have cramps and pain in the abdomen. It is also common to have light bleeding (spotting) or heavier bleeding that is like your menstrual period.   Continue to check that the IUD is still in place by feeling for the string(s) after every menstrual period, or once a month.   Keep all follow-up visits as told by your health care provider. This is important.   Contact your health care provider if you have problems with your IUD string(s), such as the string getting longer or bothering you or your sexual partner.  This information is not intended to replace advice given to you by your health care provider. Make   jes trel) is a contraceptive (birth control) device. The device is placed inside the uterus by a healthcare professional. It is used to prevent pregnancy. This device can also be used to treat heavy bleeding that occurs during your period. This medicine may be used for other purposes; ask your health care provider or pharmacist if you have questions. COMMON BRAND NAME(S): Kyleena, LILETTA, Mirena, Skyla What should I tell my health  care provider before I take this medicine? They need to know if you have any of these conditions: -abnormal Pap smear -cancer of the breast, uterus, or cervix -diabetes -endometritis -genital or pelvic infection now or in the past -have more than one sexual partner or your partner has more than one partner -heart disease -history of an ectopic or tubal pregnancy -immune system problems -IUD in place -liver disease or tumor -problems with blood clots or take blood-thinners -seizures -use intravenous drugs -uterus of unusual shape -vaginal bleeding that has not been explained -an unusual or allergic reaction to levonorgestrel, other hormones, silicone, or polyethylene, medicines, foods, dyes, or preservatives -pregnant or trying to get pregnant -breast-feeding How should I use this medicine? This device is placed inside the uterus by a health care professional. Talk to your pediatrician regarding the use of this medicine in children. Special care may be needed. Overdosage: If you think you have taken too much of this medicine contact a poison control center or emergency room at once. NOTE: This medicine is only for you. Do not share this medicine with others. What if I miss a dose? This does not apply. Depending on the brand of device you have inserted, the device will need to be replaced every 3 to 5 years if you wish to continue using this type of birth control. What may interact with this medicine? Do not take this medicine with any of the following medications: -amprenavir -bosentan -fosamprenavir This medicine may also interact with the following medications: -aprepitant -armodafinil -barbiturate medicines for inducing sleep or treating seizures -bexarotene -boceprevir -griseofulvin -medicines to treat seizures like carbamazepine, ethotoin, felbamate, oxcarbazepine, phenytoin, topiramate -modafinil -pioglitazone -rifabutin -rifampin -rifapentine -some medicines to  treat HIV infection like atazanavir, efavirenz, indinavir, lopinavir, nelfinavir, tipranavir, ritonavir -St. John's wort -warfarin This list may not describe all possible interactions. Give your health care provider a list of all the medicines, herbs, non-prescription drugs, or dietary supplements you use. Also tell them if you smoke, drink alcohol, or use illegal drugs. Some items may interact with your medicine. What should I watch for while using this medicine? Visit your doctor or health care professional for regular check ups. See your doctor if you or your partner has sexual contact with others, becomes HIV positive, or gets a sexual transmitted disease. This product does not protect you against HIV infection (AIDS) or other sexually transmitted diseases. You can check the placement of the IUD yourself by reaching up to the top of your vagina with clean fingers to feel the threads. Do not pull on the threads. It is a good habit to check placement after each menstrual period. Call your doctor right away if you feel more of the IUD than just the threads or if you cannot feel the threads at all. The IUD may come out by itself. You may become pregnant if the device comes out. If you notice that the IUD has come out use a backup birth control method like condoms and call your health care provider. Using tampons will not change the position of the   the top of your vagina with clean fingers to feel the threads. Do not pull on the threads. It is a good habit to check placement after each menstrual period. Call your doctor right away if you feel more of the IUD than just the threads or if you cannot feel the threads at all.  The IUD may come out by itself. You may become pregnant if the device comes out. If you notice that the IUD has come out use a backup birth control method like condoms and call your health care provider.  Using tampons will not change the position of the IUD and are okay to use during your period.  This IUD can be safely scanned with magnetic resonance imaging (MRI) only under specific conditions. Before you have an MRI, tell your healthcare provider that you have an IUD in place, and which type of IUD you have in place.  What side effects may I notice from receiving this medicine?  Side effects that you should report to your doctor or health care professional as soon as possible:  -allergic reactions like skin rash, itching or hives, swelling of the face, lips, or tongue  -fever, flu-like symptoms  -genital  sores  -high blood pressure  -no menstrual period for 6 weeks during use  -pain, swelling, warmth in the leg  -pelvic pain or tenderness  -severe or sudden headache  -signs of pregnancy  -stomach cramping  -sudden shortness of breath  -trouble with balance, talking, or walking  -unusual vaginal bleeding, discharge  -yellowing of the eyes or skin  Side effects that usually do not require medical attention (report to your doctor or health care professional if they continue or are bothersome):  -acne  -breast pain  -change in sex drive or performance  -changes in weight  -cramping, dizziness, or faintness while the device is being inserted  -headache  -irregular menstrual bleeding within first 3 to 6 months of use  -nausea  This list may not describe all possible side effects. Call your doctor for medical advice about side effects. You may report side effects to FDA at 1-800-FDA-1088.  Where should I keep my medicine?  This does not apply.  NOTE: This sheet is a summary. It may not cover all possible information. If you have questions about this medicine, talk to your doctor, pharmacist, or health care provider.   2019 Elsevier/Gold Standard (2016-03-17 14:14:56)

## 2018-06-14 ENCOUNTER — Ambulatory Visit (HOSPITAL_BASED_OUTPATIENT_CLINIC_OR_DEPARTMENT_OTHER)
Admission: RE | Admit: 2018-06-14 | Discharge: 2018-06-14 | Disposition: A | Discharge status: Home / Self Care | Payer: BLUE CROSS/BLUE SHIELD | Source: Ambulatory Visit | Attending: Internal Medicine | Admitting: Internal Medicine

## 2018-06-14 ENCOUNTER — Encounter (HOSPITAL_BASED_OUTPATIENT_CLINIC_OR_DEPARTMENT_OTHER): Payer: Self-pay

## 2018-06-14 DIAGNOSIS — Z1231 Encounter for screening mammogram for malignant neoplasm of breast: Secondary | ICD-10-CM | POA: Insufficient documentation

## 2018-07-08 ENCOUNTER — Ambulatory Visit (INDEPENDENT_AMBULATORY_CARE_PROVIDER_SITE_OTHER): Payer: BLUE CROSS/BLUE SHIELD | Admitting: Obstetrics & Gynecology

## 2018-07-08 ENCOUNTER — Encounter: Payer: Self-pay | Admitting: Obstetrics & Gynecology

## 2018-07-08 VITALS — BP 116/77 | HR 92 | Ht 62.0 in | Wt 176.0 lb

## 2018-07-08 DIAGNOSIS — Z30431 Encounter for routine checking of intrauterine contraceptive device: Secondary | ICD-10-CM | POA: Diagnosis not present

## 2018-07-08 NOTE — Patient Instructions (Signed)

## 2018-07-08 NOTE — Progress Notes (Signed)
History:  48 y.o. [id number] here today for today for IUD string check; Mirena IUD was placed  06/10/2018. No complaints about the Mirena, no concerning side effects. Pt reports some mild spotting and she occ has to take Naproxen.   The following portions of the patient's history were reviewed and updated as appropriate: allergies, current medications, past family history, past medical history, past social history, past surgical history and problem list. Last pap smear on 01/30/2018 was normal, neg HRHPV.  Review of Systems:  Pertinent items are noted in HPI.   Objective:   BP 116/77   Pulse 92   Ht 5\' 2"  (1.575 m)   Wt 176 lb (79.8 kg)   BMI 32.19 kg/m   CONSTITUTIONAL: Well-developed, well-nourished female in no acute distress.  HENT:  Normocephalic, atraumatic EYES: Conjunctivae and EOM are normal. No scleral icterus.  NECK: Normal range of motion SKIN: Skin is warm and dry. No rash noted. Not diaphoretic.No pallor. [place]: Alert and oriented to person, place, and time. Normal coordination.  Pelvic:  IUD strings noted coming from cervical os. Strings noted 3 cm outside of os    Assessment & Plan:  Normal IUD check. Patient to keep IUD in place for five years; can come in for removal if she desires pregnancy within the next five years. Routine preventative health maintenance measures emphasized. F/u in 1 year for annual  Caliann Leckrone L. Harraway-Smith, M.D., [name]

## 2019-07-29 ENCOUNTER — Ambulatory Visit (INDEPENDENT_AMBULATORY_CARE_PROVIDER_SITE_OTHER): Payer: 59 | Admitting: Psychiatry

## 2019-07-29 ENCOUNTER — Encounter (HOSPITAL_COMMUNITY): Payer: Self-pay | Admitting: Psychiatry

## 2019-07-29 DIAGNOSIS — F331 Major depressive disorder, recurrent, moderate: Secondary | ICD-10-CM

## 2019-07-29 MED ORDER — VENLAFAXINE HCL ER 75 MG PO CP24: 75.0000 mg | ORAL_CAPSULE | Freq: Every day | ORAL | 1 refills | Status: DC

## 2019-07-29 MED ORDER — VENLAFAXINE HCL 37.5 MG PO TABS: 37.5000 mg | ORAL_TABLET | Freq: Every morning | ORAL | 1 refills | Status: DC
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
## 2019-08-14 ENCOUNTER — Ambulatory Visit (HOSPITAL_COMMUNITY): Payer: 59 | Admitting: Licensed Clinical Social Worker

## 2019-08-14 ENCOUNTER — Other Ambulatory Visit: Payer: Self-pay

## 2019-08-21 ENCOUNTER — Ambulatory Visit (HOSPITAL_COMMUNITY): Payer: 59 | Admitting: Psychiatry

## 2019-08-22 ENCOUNTER — Ambulatory Visit (INDEPENDENT_AMBULATORY_CARE_PROVIDER_SITE_OTHER): Payer: 59 | Admitting: Psychiatry

## 2019-08-22 ENCOUNTER — Encounter (HOSPITAL_COMMUNITY): Payer: Self-pay | Admitting: Psychiatry

## 2019-08-22 DIAGNOSIS — F331 Major depressive disorder, recurrent, moderate: Secondary | ICD-10-CM
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
## 2019-08-28 ENCOUNTER — Ambulatory Visit (INDEPENDENT_AMBULATORY_CARE_PROVIDER_SITE_OTHER): Payer: 59 | Admitting: Licensed Clinical Social Worker

## 2019-08-28 DIAGNOSIS — F331 Major depressive disorder, recurrent, moderate: Secondary | ICD-10-CM | POA: Diagnosis not present
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
## 2019-09-03 ENCOUNTER — Telehealth (HOSPITAL_COMMUNITY): Payer: Self-pay

## 2019-09-03 MED ORDER — VENLAFAXINE HCL ER 75 MG PO CP24: 75.0000 mg | ORAL_CAPSULE | Freq: Every day | ORAL | 0 refills | Status: DC

## 2019-09-03 MED ORDER — VENLAFAXINE HCL 37.5 MG PO TABS: 37.5000 mg | ORAL_TABLET | Freq: Every morning | ORAL | 0 refills | Status: DC

## 2019-09-03 NOTE — Telephone Encounter (Signed)
Patient is in [place] for 2 days. She left her Effexor at home and wants to know if we can send 2 pills of both effoxor rx's to a a pharmacy in [place]

## 2019-09-03 NOTE — Telephone Encounter (Signed)
Sent 2 day supply. Left vm informing patient

## 2019-09-03 NOTE — Telephone Encounter (Signed)
You can send but she may have to pay if insurance dosnt cover

## 2019-09-23 ENCOUNTER — Other Ambulatory Visit (HOSPITAL_COMMUNITY): Payer: Self-pay

## 2019-09-23 MED ORDER — VENLAFAXINE HCL 37.5 MG PO TABS: 37.5000 mg | ORAL_TABLET | Freq: Every morning | ORAL | 0 refills | Status: DC

## 2019-09-23 MED ORDER — VENLAFAXINE HCL ER 75 MG PO CP24: 75.0000 mg | ORAL_CAPSULE | Freq: Every day | ORAL | 0 refills | Status: DC

## 2019-09-26 ENCOUNTER — Ambulatory Visit: Payer: Self-pay | Attending: Internal Medicine

## 2019-09-26 DIAGNOSIS — Z23 Encounter for immunization: Secondary | ICD-10-CM

## 2019-09-26 NOTE — Progress Notes (Signed)
   Covid-19 Vaccination Clinic  Name:  Deanna Mcdonald    MRN: [id number] DOB: 01-16-1971  09/26/2019  [name] was observed post Covid-19 immunization for 15 minutes without incident. She was provided with Vaccine Information Sheet and instruction to access the V-Safe system.   [name] was instructed to call 911 with any severe reactions post vaccine: Marland Kitchen Difficulty breathing  . Swelling of face and throat  . A fast heartbeat  . A bad rash all over body  . Dizziness and weakness   Immunizations Administered    Name Date Dose VIS Date Route   Pfizer COVID-19 Vaccine 09/26/2019  2:23 PM 0.3 mL 05/30/2019 Intramuscular   Manufacturer: [place]   [place]ot: [id number]   [place]: [id number]

## 2019-09-30 ENCOUNTER — Telehealth (HOSPITAL_COMMUNITY): Payer: Self-pay | Admitting: Psychiatry

## 2019-09-30 ENCOUNTER — Ambulatory Visit (INDEPENDENT_AMBULATORY_CARE_PROVIDER_SITE_OTHER): Payer: 59 | Admitting: Licensed Clinical Social Worker

## 2019-09-30 DIAGNOSIS — F331 Major depressive disorder, recurrent, moderate: Secondary | ICD-10-CM

## 2019-09-30 NOTE — Telephone Encounter (Signed)
Pt calling because she wants to inform De Nurse she is getting [facility] treatment. She wanted to make sure you were aware and it would not affect meds.   cb [phone]

## 2019-09-30 NOTE — Telephone Encounter (Signed)
Left message informing of the following.   Nothing Further Needed at this time.

## 2019-09-30 NOTE — Telephone Encounter (Signed)
Provider giving [facility] should be aware of her meds and discuss with her. She can continue full psych services with them as well to have continuity

## 2019-10-02 NOTE — Progress Notes (Signed)
Virtual Visit via Video Note  I connected with [name] on 10/02/19 at  3:00 PM EDT by a video enabled telemedicine application and verified that I am speaking with the correct person using two identifiers.  Location: Patient: Home Provider: Office   I discussed the limitations of evaluation and management by telemedicine and the availability of in person appointments. The patient expressed understanding and agreed to proceed.    THERAPIST PROGRESS NOTE  Session Time: 3:00 pm-3:45 pm  Participation Level: Active  Behavioral Response: CasualAlertAnxious and Depressed  Type of Therapy: Individual Therapy  Treatment Goals addressed: Coping  Interventions: CBT and Solution Focused  Case Summary: Deanna Mcdonald is a 49 y.o. female who presents oriented x5 (person, place, situation, time, and object), casually dressed, appropriately groomed, average height, average weight, and cooperative to address mood and anxiety. Patient has a history of medical treatment including narcolepsy. Patient has a history of mental health treatment including outpatient therapy, and medication management. Patient denies suicidal and homicidal ideations. Patient denies psychosis including auditory and visual hallucinations. Patient denies substance abuse. Patient is at low risk for lethality.  Session#1  Physically: Patient has started [place] therapy. She reports improved mental clarity, improved energy, and sleeping a little better. She still has some aches and pains but they are getting better.  Spiritually/values: Patient is struggling spiritually. She struggles with feeling like there is a God who could love her unconditionally with all that she has done in the past. She also struggles with be religious and honoring her daughter's struggle with gender identity. Patient also noted that she had become obsessive with religion in the past.  Relationships: Patient feels like she struggles with her  communications with others.  Emotionally/Mentally/Behavior: Patient is viewing things better. She feels like her mood has improved. Patient is not experiencing as much fear and almost feels lost because fear had been such a part of her personality. Patient is hesitant to fully trust the [place] treatment. After discussion, patient identified the worst case scenario for her is the treatment could cause death but this is unlikely, the best case scenario would be she would continue to get better, and the most likely outcome is that she will be ok.   Patient engaged in session. Patient responded well to interventions. Patient continues to meet the criteria for Major Depressive disorder, recurrent episode, moderate. Patient will continue in outpatient therapy due to being the least restrictive service to meet her needs. Patient made minimal progress on her goals at this time.    Suicidal/Homicidal: Negativewithout intent/plan  Therapist Response: Therapist reviewed patient's recent thoughts and behaviors. Therapist utilized CBT to address mood. Therapist processed patient's feelings to identify triggers for mood. Therapist had patient identify what has gone well since the last session. Therapist had patient identify the worst, best, and most likely outcome of treatment/situations.   Plan: Return again in 1 weeks.  Diagnosis: Axis I: Major Depressive disorder, recurrent episode, moderate    Axis II: No diagnosis  I discussed the assessment and treatment plan with the patient. The patient was provided an opportunity to ask questions and all were answered. The patient agreed with the plan and demonstrated an understanding of the instructions.   The patient was advised to call back or seek an in-person evaluation if the symptoms worsen or if the condition fails to improve as anticipated.  I provided 45 minutes of non-face-to-face time during this encounter.  Glori Bickers, LCSW 10/02/2019

## 2019-10-07 ENCOUNTER — Ambulatory Visit (INDEPENDENT_AMBULATORY_CARE_PROVIDER_SITE_OTHER): Payer: 59 | Admitting: Licensed Clinical Social Worker

## 2019-10-07 DIAGNOSIS — F331 Major depressive disorder, recurrent, moderate: Secondary | ICD-10-CM | POA: Diagnosis not present

## 2019-10-08 NOTE — Progress Notes (Signed)
Virtual Visit via Video Note  I connected with Deanna Mcdonald on 10/08/19 at  3:00 PM EDT by a video enabled telemedicine application and verified that I am speaking with the correct person using two identifiers.  Location: Patient: Home Provider: Office   I discussed the limitations of evaluation and management by telemedicine and the availability of in person appointments. The patient expressed understanding and agreed to proceed.    THERAPIST PROGRESS NOTE  Session Time: 3:00 pm-3:45 pm  Participation Level: Active  Behavioral Response: CasualAlertAnxious and Depressed  Type of Therapy: Individual Therapy  Treatment Goals addressed: Coping  Interventions: CBT and Solution Focused  Case Summary: Deanna Mcdonald is a 49 y.o. female who presents oriented x5 (person, place, situation, time, and object), casually dressed, appropriately groomed, average height, average weight, and cooperative to address mood and anxiety. Patient has a history of medical treatment including narcolepsy. Patient has a history of mental health treatment including outpatient therapy, and medication management. Patient denies suicidal and homicidal ideations. Patient denies psychosis including auditory and visual hallucinations. Patient denies substance abuse. Patient is at low risk for lethality.  Session#2  Physically: Patient is doing well physically. She continues to feels more energy, mental clarity, etc.   Spiritually/values: Patient is doing ok spiritually.   Relationships: Patient feels like relationships are draining, exhausting, and confusing. She feels like her experience with her marriage taught her a lesson that she could never forget. She gave the best and did all that she could but she still got a divorce. She felt a lot of guilt due to her mother continuing to refer to her ex husband as her husband, being taught that Christians don't get divorced, and her own feelings of failure. After  discussion, patient understood there was nothing more she could have done with her marriage to "fix" things. Patient is also trying to set boundaries with her family. They don't acknowledge her divorce or that she has hearing loss.  Emotionally/Mentally/Behavior: Patient is doing well. She mood is improving. She is trying to navigate relationships and take care of herself.   Patient engaged in session. Patient responded well to interventions. Patient continues to meet the criteria for Major Depressive disorder, recurrent episode, moderate. Patient will continue in outpatient therapy due to being the least restrictive service to meet her needs. Patient made minimal progress on her goals at this time.    Suicidal/Homicidal: Negativewithout intent/plan  Therapist Response: Therapist reviewed patient's recent thoughts and behaviors. Therapist utilized CBT to address mood. Therapist processed patient's feelings to identify triggers for mood. Therapist had patient identify what has gone well since the last session. Therapist discussed with patient her relationships.   Plan: Return again in 1 weeks.  Diagnosis: Axis I: Major Depressive disorder, recurrent episode, moderate    Axis II: No diagnosis  I discussed the assessment and treatment plan with the patient. The patient was provided an opportunity to ask questions and all were answered. The patient agreed with the plan and demonstrated an understanding of the instructions.   The patient was advised to call back or seek an in-person evaluation if the symptoms worsen or if the condition fails to improve as anticipated.  I provided 45 minutes of non-face-to-face time during this encounter.  Glori Bickers, LCSW 10/08/2019

## 2019-10-14 ENCOUNTER — Telehealth (INDEPENDENT_AMBULATORY_CARE_PROVIDER_SITE_OTHER): Payer: 59 | Admitting: Licensed Clinical Social Worker

## 2019-10-14 DIAGNOSIS — F331 Major depressive disorder, recurrent, moderate: Secondary | ICD-10-CM | POA: Diagnosis not present

## 2019-10-14 NOTE — Progress Notes (Signed)
Virtual Visit via Video Note  I connected with [name] on 10/14/19 at 11:00 AM EDT by a video enabled telemedicine application and verified that I am speaking with the correct person using two identifiers.  Location: Patient: Home Provider: Office   I discussed the limitations of evaluation and management by telemedicine and the availability of in person appointments. The patient expressed understanding and agreed to proceed.    THERAPIST PROGRESS NOTE  Session Time: 11:00 am-11:57 am  Participation Level: Active  Behavioral Response: CasualAlertAnxious and Depressed  Type of Therapy: Individual Therapy  Treatment Goals addressed: Coping  Interventions: CBT and Solution Focused  Case Summary: Deanna Mcdonald is a 49 y.o. female who presents oriented x5 (person, place, situation, time, and object), casually dressed, appropriately groomed, average height, average weight, and cooperative to address mood and anxiety. Patient has a history of medical treatment including narcolepsy. Patient has a history of mental health treatment including outpatient therapy, and medication management. Patient denies suicidal and homicidal ideations. Patient denies psychosis including auditory and visual hallucinations. Patient denies substance abuse. Patient is at low risk for lethality.  Session#3  Physically: Patient continues to feel well physically. She did note that she feels like her medication is too strong now and makes her sick. Marland Kitchen   Spiritually/values: No issues identified.    Relationships: Patient saw her ex husband when he picked up her daughter. Patient felt bad afterwards and was hypervigilant. She feels like her sometimes her children do things to aggravate her which upsets her.  Emotionally/Mentally/Behavior: Patient's mood was stable. She shared a few situations that got her frustrated. She noted she asked her daughter to unload the dishwasher, daughter acknowledged her and said  yes, and then her daughter put the dirty dishes in with the clean. Patient also noted that her clock was off by an hour and a half. It is a new clock with new batteries. She felt like her son did it to annoy her. Patient was accused by her son as being paranoid. After discussion, patient understood to manage her mood she needs to identify what she is feeling, any thoughts associated with the feeling or situation, allow herself to feel, calm her body and mind down with coping skills, and then address a situation as needed. Patient understood that her anxiety and irritability trigger her "lizard brain" which is the flight, flight, and impulsive behavior part of her brain by calming her body.   Patient engaged in session. Patient responded well to interventions. Patient continues to meet the criteria for Major Depressive disorder, recurrent episode, moderate. Patient will continue in outpatient therapy due to being the least restrictive service to meet her needs. Patient made minimal progress on her goals at this time.    Suicidal/Homicidal: Negativewithout intent/plan  Therapist Response: Therapist reviewed patient's recent thoughts and behaviors. Therapist utilized CBT to address mood. Therapist processed patient's feelings to identify triggers for mood. Therapist had patient identify what has gone well since the last session. Therapist discussed with patient her irritability and her perceived feelings of paranoia.   Plan: Return again in 1 weeks.  Diagnosis: Axis I: Major Depressive disorder, recurrent episode, moderate    Axis II: No diagnosis  I discussed the assessment and treatment plan with the patient. The patient was provided an opportunity to ask questions and all were answered. The patient agreed with the plan and demonstrated an understanding of the instructions.   The patient was advised to call back or seek an  in-person evaluation if the symptoms worsen or if the condition fails to  improve as anticipated.  I provided 45 minutes of non-face-to-face time during this encounter.  Glori Bickers, LCSW 10/14/2019

## 2019-10-15 ENCOUNTER — Telehealth (HOSPITAL_COMMUNITY): Payer: Self-pay

## 2019-10-15 NOTE — Telephone Encounter (Signed)
She should consult with [facility] provider or team in regard to medications. Also can consider to lower effexor by 37.5mg  if needed but should discuss with [facility] provider

## 2019-10-15 NOTE — Telephone Encounter (Signed)
Patient left a vm stating that she needs a medication adjustment. She states the [facility] treatment is working really well and when she takes her medications they are making her sick on the stomach. Please let us know if patient needs an appointment. Please advise.

## 2019-10-15 NOTE — Telephone Encounter (Signed)
Informed patient of everything Dr. De Nurse stated in previous message. She stated her understanding.

## 2019-10-20 ENCOUNTER — Ambulatory Visit: Payer: Self-pay | Attending: Internal Medicine

## 2019-10-20 DIAGNOSIS — Z23 Encounter for immunization: Secondary | ICD-10-CM

## 2019-10-20 NOTE — Progress Notes (Signed)
   Covid-19 Vaccination Clinic  Name:  Deanna Mcdonald    MRN: [id number] DOB: 1971/02/26  10/20/2019  [name] was observed post Covid-19 immunization for 15 minutes without incident. She was provided with Vaccine Information Sheet and instruction to access the V-Safe system.   [name] was instructed to call 911 with any severe reactions post vaccine: Marland Kitchen Difficulty breathing  . Swelling of face and throat  . A fast heartbeat  . A bad rash all over body  . Dizziness and weakness   Immunizations Administered    Name Date Dose VIS Date Route   Pfizer COVID-19 Vaccine 10/20/2019  1:26 PM 0.3 mL 08/13/2018 Intramuscular   Manufacturer: [place]   Lot: [id number]   Bureau: [id number]

## 2019-10-21 ENCOUNTER — Telehealth (HOSPITAL_COMMUNITY): Payer: 59 | Admitting: Licensed Clinical Social Worker

## 2019-10-22 ENCOUNTER — Other Ambulatory Visit (HOSPITAL_COMMUNITY): Payer: Self-pay

## 2019-10-22 MED ORDER — VENLAFAXINE HCL 37.5 MG PO TABS: 37.5000 mg | ORAL_TABLET | Freq: Every morning | ORAL | 0 refills | Status: DC

## 2019-10-23 ENCOUNTER — Encounter (HOSPITAL_COMMUNITY): Payer: Self-pay | Admitting: Psychiatry

## 2019-10-23 ENCOUNTER — Telehealth (INDEPENDENT_AMBULATORY_CARE_PROVIDER_SITE_OTHER): Payer: 59 | Admitting: Psychiatry

## 2019-10-23 DIAGNOSIS — F331 Major depressive disorder, recurrent, moderate: Secondary | ICD-10-CM
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
## 2019-11-12 ENCOUNTER — Ambulatory Visit (INDEPENDENT_AMBULATORY_CARE_PROVIDER_SITE_OTHER): Payer: 59 | Admitting: Licensed Clinical Social Worker

## 2019-11-12 DIAGNOSIS — F331 Major depressive disorder, recurrent, moderate: Secondary | ICD-10-CM

## 2019-11-13 NOTE — Progress Notes (Signed)
Virtual Visit via Video Note  I connected with Deanna Mcdonald on 11/13/19 at 11:00 AM EDT by a video enabled telemedicine application and verified that I am speaking with the correct person using two identifiers.  Location: Patient: Home Provider: Office   I discussed the limitations of evaluation and management by telemedicine and the availability of in person appointments. The patient expressed understanding and agreed to proceed.    THERAPIST PROGRESS NOTE  Session Time: 11:00 am-11:45 am  Participation Level: Active  Behavioral Response: CasualAlertAnxious and Depressed  Type of Therapy: Individual Therapy  Treatment Goals addressed: Coping  Interventions: CBT and Solution Focused  Case Summary: Deanna Mcdonald is a 49 y.o. female who presents oriented x5 (person, place, situation, time, and object), casually dressed, appropriately groomed, average height, average weight, and cooperative to address mood and anxiety. Patient has a history of medical treatment including narcolepsy. Patient has a history of mental health treatment including outpatient therapy, and medication management. Patient denies suicidal and homicidal ideations. Patient denies psychosis including auditory and visual hallucinations. Patient denies substance abuse. Patient is at low risk for lethality.  Session#4  Physically: Patient continues to feel well physically. Spiritually/values: No issues identified.    Relationships: Patient's relationships are going well. Her son is moving out. Patient is choosing to view it as it is time for him to move out and leave the nest instead of she did something to drive him away.  Emotionally/Mentally/Behavior: Patient's mood was stable. She feels like she has made progress on her goals. After discussion, patient understood that she needs to work on the messages she tells herself about herself, others, and the world. Patient understood that she can choose how to interpret  situations in a negative way or a more healthy way. Patient agreed to work on challenging her negative thoughts and identify healthy thoughts.   Patient engaged in session. Patient responded well to interventions. Patient continues to meet the criteria for Major Depressive disorder, recurrent episode, moderate. Patient will continue in outpatient therapy due to being the least restrictive service to meet her needs. Patient made minimal progress on her goals at this time.    Suicidal/Homicidal: Negativewithout intent/plan  Therapist Response: Therapist reviewed patient's recent thoughts and behaviors. Therapist utilized CBT to address mood. Therapist processed patient's feelings to identify triggers for mood. Therapist had patient identify what has gone well since the last session. Therapist updated patient's treatment plan. .   Plan: Return again in 1 weeks.  Diagnosis: Axis I: Major Depressive disorder, recurrent episode, moderate    Axis II: No diagnosis  I discussed the assessment and treatment plan with the patient. The patient was provided an opportunity to ask questions and all were answered. The patient agreed with the plan and demonstrated an understanding of the instructions.   The patient was advised to call back or seek an in-person evaluation if the symptoms worsen or if the condition fails to improve as anticipated.  I provided 45 minutes of non-face-to-face time during this encounter.  Glori Bickers, LCSW 11/13/2019

## 2019-11-18 ENCOUNTER — Telehealth (HOSPITAL_COMMUNITY): Payer: Self-pay

## 2019-11-18 NOTE — Telephone Encounter (Signed)
Patient wants to know if she can decrease Effexor to 37.5mg  BID. If so I can send refill to the pharmarcy

## 2019-11-18 NOTE — Telephone Encounter (Signed)
Left vm informing patient of everything Dr. De Nurse stated in previous message.

## 2019-11-18 NOTE — Telephone Encounter (Signed)
Would not recommend to decrease effexor if combination of meds and TMS is helping . Maintenance dose should be the same not lower or she can follow up with [facility] provider and their input review

## 2019-11-19 ENCOUNTER — Other Ambulatory Visit (HOSPITAL_COMMUNITY): Payer: Self-pay

## 2019-11-19 MED ORDER — VENLAFAXINE HCL 37.5 MG PO TABS: 37.5000 mg | ORAL_TABLET | Freq: Two times a day (BID) | ORAL | 0 refills | Status: DC

## 2019-11-19 MED ORDER — VENLAFAXINE HCL 37.5 MG PO TABS: 37.5000 mg | ORAL_TABLET | Freq: Every morning | ORAL | 0 refills | Status: DC

## 2019-11-19 MED ORDER — VENLAFAXINE HCL ER 75 MG PO CP24: 75.0000 mg | ORAL_CAPSULE | Freq: Every day | ORAL | 0 refills | Status: DC

## 2019-11-19 NOTE — Telephone Encounter (Signed)
Patient called back this morning stating that the reason she wants to taper down on the Effexor is because the [place] is doing so well. She states she has been nauseous, vomiting, sweating and having a little confusion and she believes it is because of the high dose of Effexor in the evening. She only wants to take the Effexor 37.5 BID. She states that she has to take [employer] every day just to keep the 75mg  down. Please advise.

## 2019-11-19 NOTE — Addendum Note (Signed)
Addended by: Bishop Limbo on: 11/19/2019 11:40 AM   Modules accepted: Orders

## 2019-11-19 NOTE — Telephone Encounter (Signed)
Ok can cut down dose as requested

## 2019-11-19 NOTE — Telephone Encounter (Signed)
Left vm informing patient that per Dr. De Nurse she can decrease the Effexor to 37.5mg  BID. I also changed it in the chart and sent to pharmacy. Nothing further is needed at this time.

## 2019-11-27 ENCOUNTER — Encounter (HOSPITAL_COMMUNITY): Payer: Self-pay | Admitting: Psychiatry

## 2019-11-27 ENCOUNTER — Telehealth (INDEPENDENT_AMBULATORY_CARE_PROVIDER_SITE_OTHER): Payer: 59 | Admitting: Psychiatry

## 2019-11-27 DIAGNOSIS — F331 Major depressive disorder, recurrent, moderate: Secondary | ICD-10-CM
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
## 2019-12-18 ENCOUNTER — Ambulatory Visit (INDEPENDENT_AMBULATORY_CARE_PROVIDER_SITE_OTHER): Payer: 59 | Admitting: Licensed Clinical Social Worker

## 2019-12-18 DIAGNOSIS — F331 Major depressive disorder, recurrent, moderate: Secondary | ICD-10-CM | POA: Diagnosis not present

## 2019-12-19 NOTE — Progress Notes (Signed)
Virtual Visit via Video Note  I connected with Deanna Mcdonald on 12/19/19 at 11:00 AM EDT by a video enabled telemedicine application and verified that I am speaking with the correct person using two identifiers.  Location: Patient: Home Provider: Office   I discussed the limitations of evaluation and management by telemedicine and the availability of in person appointments. The patient expressed understanding and agreed to proceed.    THERAPIST PROGRESS NOTE  Session Time: 11:00 am-11:45 am  Participation Level: Active  Behavioral Response: CasualAlertAnxious and Depressed  Type of Therapy: Individual Therapy  Treatment Goals addressed: Coping  Interventions: CBT and Solution Focused  Case Summary: Deanna Mcdonald is a 49 y.o. female who presents oriented x5 (person, place, situation, time, and object), casually dressed, appropriately groomed, average height, average weight, and cooperative to address mood and anxiety. Patient has a history of medical treatment including narcolepsy. Patient has a history of mental health treatment including outpatient therapy, and medication management. Patient denies suicidal and homicidal ideations. Patient denies psychosis including auditory and visual hallucinations. Patient denies substance abuse. Patient is at low risk for lethality.  Session#5  Physically: Patient is doing well physically overall. Her sleep, energy level, and appetite are stable.  Spiritually/values: No issues identified.    Relationships: Patient's relationships are going well. Her son is still in the home. Patient has been concerned about healthy relationships. She shared that her former sister in [place] son passed away. Patient thought about going to the funeral but then decided that it would not be healthy for her so she sent flowers instead. She cares for her sister in law but doesn't want to interact with her ex. Patient has also noted she has noticed that she can  instantly click with people and with others feels like there is a wall. Patient explained that when she interacts with people who she feels like their is a wall, she takes a "step back," respects it, and doesn't push. After discussion, patient understood that she needs to consider is it her own "stuff" that is causing the wall or is the other person shut off. Patient also recognized that there are some "walls" that are not worth dismantling due to boundaries.  Emotionally/Mentally/Behavior: Patient's mood was stable. She is working on improving her relationships or recognizing her healthy relationships as well as making healthy choices.   Patient engaged in session. Patient responded well to interventions. Patient continues to meet the criteria for Major Depressive disorder, recurrent episode, moderate. Patient will continue in outpatient therapy due to being the least restrictive service to meet her needs. Patient made minimal progress on her goals at this time.    Suicidal/Homicidal: Negativewithout intent/plan  Therapist Response: Therapist reviewed patient's recent thoughts and behaviors. Therapist utilized CBT to address mood. Therapist processed patient's feelings to identify triggers for mood. Therapist discussed with patient healthy relationships and recognizing her own "stuff" that may be impacting her ability to form connections.   Plan: Return again in 1 weeks.  Diagnosis: Axis I: Major Depressive disorder, recurrent episode, moderate    Axis II: No diagnosis  I discussed the assessment and treatment plan with the patient. The patient was provided an opportunity to ask questions and all were answered. The patient agreed with the plan and demonstrated an understanding of the instructions.   The patient was advised to call back or seek an in-person evaluation if the symptoms worsen or if the condition fails to improve as anticipated.  I provided 45 minutes  of non-face-to-face time during  this encounter.  Glori Bickers, LCSW 12/19/2019

## 2019-12-23 ENCOUNTER — Encounter (HOSPITAL_COMMUNITY): Payer: Self-pay | Admitting: Psychiatry

## 2019-12-23 ENCOUNTER — Telehealth (INDEPENDENT_AMBULATORY_CARE_PROVIDER_SITE_OTHER): Payer: 59 | Admitting: Psychiatry

## 2019-12-23 DIAGNOSIS — F331 Major depressive disorder, recurrent, moderate: Secondary | ICD-10-CM

## 2019-12-23 MED ORDER — VENLAFAXINE HCL 37.5 MG PO TABS: 37.5000 mg | ORAL_TABLET | Freq: Two times a day (BID) | ORAL | 0 refills | Status: DC
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
## 2020-01-01 ENCOUNTER — Ambulatory Visit (INDEPENDENT_AMBULATORY_CARE_PROVIDER_SITE_OTHER): Payer: 59 | Admitting: Licensed Clinical Social Worker

## 2020-01-01 DIAGNOSIS — F331 Major depressive disorder, recurrent, moderate: Secondary | ICD-10-CM | POA: Diagnosis not present
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
## 2020-01-08 ENCOUNTER — Telehealth (INDEPENDENT_AMBULATORY_CARE_PROVIDER_SITE_OTHER): Payer: 59 | Admitting: Psychiatry

## 2020-01-08 ENCOUNTER — Encounter (HOSPITAL_COMMUNITY): Payer: Self-pay | Admitting: Psychiatry

## 2020-01-08 DIAGNOSIS — F331 Major depressive disorder, recurrent, moderate: Secondary | ICD-10-CM | POA: Diagnosis not present

## 2020-01-08 MED ORDER — VENLAFAXINE HCL 37.5 MG PO TABS: 37.5000 mg | ORAL_TABLET | Freq: Two times a day (BID) | ORAL | 0 refills | Status: DC
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
## 2020-01-22 ENCOUNTER — Ambulatory Visit (INDEPENDENT_AMBULATORY_CARE_PROVIDER_SITE_OTHER): Payer: 59 | Admitting: Licensed Clinical Social Worker

## 2020-01-22 DIAGNOSIS — F331 Major depressive disorder, recurrent, moderate: Secondary | ICD-10-CM

## 2020-01-23 NOTE — Progress Notes (Signed)
Virtual Visit via Video Note  I connected with Deanna Mcdonald on 01/23/20 at 11:00 AM EDT by a video enabled telemedicine application and verified that I am speaking with the correct person using two identifiers.  Location: Patient: Home Provider: Office   I discussed the limitations of evaluation and management by telemedicine and the availability of in person appointments. The patient expressed understanding and agreed to proceed.    THERAPIST PROGRESS NOTE  Session Time: 11:00 am-11:45 am  Participation Level: Active  Behavioral Response: CasualAlertAnxious and Depressed  Type of Therapy: Individual Therapy  Treatment Goals addressed: Coping  Interventions: CBT and Solution Focused  Case Summary: Deanna Mcdonald is a 49 y.o. female who presents oriented x5 (person, place, situation, time, and object), casually dressed, appropriately groomed, average height, average weight, and cooperative to address mood and anxiety. Patient has a history of medical treatment including narcolepsy. Patient has a history of mental health treatment including outpatient therapy, and medication management. Patient denies suicidal and homicidal ideations. Patient denies psychosis including auditory and visual hallucinations. Patient denies substance abuse. Patient is at low risk for lethality.  Session#7  Physically: Patient is doing well physically overall.  Spiritually/values: No issues identified.    Relationships: Patient is getting along with her family. She had a birthday brunch with her children. Her son attended. She feels like things are back to the "norm" with them. Patient is concerned with her sister wanted to get her father's home in [place] signed over to patient's sister to make repairs, etc and maintain the home. Patient doesn't feel like it is fair and feels like her mother should have the final say in what happens to her father's home.  Emotionally/Mentally/Behavior: Patient's  mood was stable. She has an internal voice that she wonders if she should listen to. When she heard about her sister wanting to sign the home over her internal voice said it wasn't fair. She feels like when she has listen to that voice in the past it has gotten her no where. She also said that her mother used to say that she as "full of the devil" when she was younger so she has questioned that internal voice. After discussion, patient understood that her internal voice pops up with something doesn't sit right with her values and she is wanting to speak up. She feels like her voice has been taken from her in childhood and in her marriage. She has been afraid to speak up with that internal voice arises. Patient understood it is better to speak up and express her feelings/views rather than bury her feels and regret not speaking up.    Patient engaged in session. Patient responded well to interventions. Patient continues to meet the criteria for Major Depressive disorder, recurrent episode, moderate. Patient will continue in outpatient therapy due to being the least restrictive service to meet her needs. Patient made minimal progress on her goals at this time.    Suicidal/Homicidal: Negativewithout intent/plan  Therapist Response: Therapist reviewed patient's recent thoughts and behaviors. Therapist utilized CBT to address mood. Therapist processed patient's feelings to identify triggers for mood. Therapist discussed with patient her "internal voice."  Plan: Return again in 1 weeks.  Diagnosis: Axis I: Major Depressive disorder, recurrent episode, moderate    Axis II: No diagnosis  I discussed the assessment and treatment plan with the patient. The patient was provided an opportunity to ask questions and all were answered. The patient agreed with the plan and demonstrated an  understanding of the instructions.   The patient was advised to call back or seek an in-person evaluation if the symptoms worsen  or if the condition fails to improve as anticipated.  I provided 45 minutes of non-face-to-face time during this encounter.  Glori Bickers, LCSW 01/23/2020

## 2020-02-05 ENCOUNTER — Ambulatory Visit (INDEPENDENT_AMBULATORY_CARE_PROVIDER_SITE_OTHER): Payer: 59 | Admitting: Licensed Clinical Social Worker

## 2020-02-05 DIAGNOSIS — F331 Major depressive disorder, recurrent, moderate: Secondary | ICD-10-CM | POA: Diagnosis not present

## 2020-02-06 NOTE — Progress Notes (Signed)
Virtual Visit via Video Note  I connected with Deanna Mcdonald on 02/06/20 at 11:00 AM EDT by a video enabled telemedicine application and verified that I am speaking with the correct person using two identifiers.  Location: Patient: Home Provider: Office   I discussed the limitations of evaluation and management by telemedicine and the availability of in person appointments. The patient expressed understanding and agreed to proceed.    THERAPIST PROGRESS NOTE  Session Time: 11:00 am-11:45 am  Participation Level: Active  Behavioral Response: CasualAlertAnxious and Depressed  Type of Therapy: Individual Therapy  Treatment Goals addressed: Coping  Interventions: CBT and Solution Focused  Case Summary: Deanna Mcdonald is a 49 y.o. female who presents oriented x5 (person, place, situation, time, and object), casually dressed, appropriately groomed, average height, average weight, and cooperative to address mood and anxiety. Patient has a history of medical treatment including narcolepsy. Patient has a history of mental health treatment including outpatient therapy, and medication management. Patient denies suicidal and homicidal ideations. Patient denies psychosis including auditory and visual hallucinations. Patient denies substance abuse. Patient is at low risk for lethality.  Session#8  Physically: Patient is doing well physically overall.  Spiritually/values: No issues identified.    Relationships: Patient is learning to identify and respect boundaries. She is working on managing her mood and not taking on other peoples "stuff." Patient shared her concerns for her daughter going off to college and how to maintain boundaries. After discussion, patient understood that her daughter will learn and she will make mistakes. She will always have the role of mother but it will shift and adjust like it has throughout her children's life. Patient is going to contemplate on her role as mother  with two adult children now.  Emotionally/Mentally/Behavior: Patient's mood was stable. 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 Patient feels like she would be most comfortable with highschool students. She feels like she could relate to them best.     Patient engaged in session. Patient responded well to interventions. Patient continues to meet the criteria for Major Depressive disorder, recurrent episode, moderate. Patient will continue in outpatient therapy due to being the least restrictive service to meet her needs. Patient made minimal progress on her goals at this time.    Suicidal/Homicidal: Negativewithout intent/plan  Therapist Response: Therapist reviewed patient's recent thoughts and behaviors. Therapist utilized CBT to address mood. Therapist processed patient's feelings to identify triggers for mood. Therapist discussed with patient her boundaries.   Plan: Return again in 1 weeks.  Diagnosis: Axis I: Major Depressive disorder, recurrent episode, moderate    Axis II: No diagnosis  I discussed the assessment and treatment plan with the patient. The patient was provided an opportunity to ask questions and all were answered. The patient agreed with the plan and demonstrated an understanding of the instructions.   The patient was advised to call back or seek an in-person evaluation if the symptoms worsen or if the condition fails to improve as anticipated.  I provided 45 minutes of non-face-to-face time during this encounter.  Glori Bickers, LCSW 02/06/2020

## 2020-02-13 ENCOUNTER — Encounter (HOSPITAL_BASED_OUTPATIENT_CLINIC_OR_DEPARTMENT_OTHER): Payer: Self-pay | Admitting: Emergency Medicine

## 2020-02-13 ENCOUNTER — Other Ambulatory Visit: Payer: Self-pay

## 2020-02-13 DIAGNOSIS — I82409 Acute embolism and thrombosis of unspecified deep veins of unspecified lower extremity: Secondary | ICD-10-CM | POA: Diagnosis not present

## 2020-02-13 DIAGNOSIS — F909 Attention-deficit hyperactivity disorder, unspecified type: Secondary | ICD-10-CM | POA: Insufficient documentation

## 2020-02-13 DIAGNOSIS — Z91040 Latex allergy status: Secondary | ICD-10-CM | POA: Insufficient documentation

## 2020-02-13 DIAGNOSIS — Z79899 Other long term (current) drug therapy: Secondary | ICD-10-CM | POA: Insufficient documentation

## 2020-02-13 DIAGNOSIS — Z7901 Long term (current) use of anticoagulants: Secondary | ICD-10-CM | POA: Insufficient documentation

## 2020-02-13 DIAGNOSIS — M7989 Other specified soft tissue disorders: Secondary | ICD-10-CM | POA: Diagnosis present

## 2020-02-13 NOTE — ED Triage Notes (Signed)
Pt c/o swelling and pain to left lower leg. No known injury.

## 2020-02-14 ENCOUNTER — Encounter (HOSPITAL_BASED_OUTPATIENT_CLINIC_OR_DEPARTMENT_OTHER): Payer: Self-pay | Admitting: Radiology

## 2020-02-14 ENCOUNTER — Emergency Department (HOSPITAL_BASED_OUTPATIENT_CLINIC_OR_DEPARTMENT_OTHER)
Admit: 2020-02-14 | Discharge: 2020-02-14 | Disposition: A | Discharge status: Home / Self Care | Payer: 59 | Attending: Emergency Medicine | Admitting: Emergency Medicine

## 2020-02-14 ENCOUNTER — Emergency Department (HOSPITAL_BASED_OUTPATIENT_CLINIC_OR_DEPARTMENT_OTHER)
Admission: EM | Admit: 2020-02-14 | Discharge: 2020-02-14 | Disposition: A | Discharge status: Home / Self Care | Payer: 59 | Attending: Emergency Medicine | Admitting: Emergency Medicine

## 2020-02-14 DIAGNOSIS — R0989 Other specified symptoms and signs involving the circulatory and respiratory systems: Secondary | ICD-10-CM

## 2020-02-14 LAB — D-DIMER, QUANTITATIVE: D-Dimer, Quant: 0.71 ug/mL-FEU — ABNORMAL HIGH (ref 0.00–0.50)

## 2020-02-14 MED ORDER — RIVAROXABAN 15 MG PO TABS
15.0000 mg | ORAL_TABLET | Freq: Once | ORAL | Status: AC
  Administered 2020-02-14: 15 mg via ORAL
  Filled 2020-02-14: qty 1

## 2020-02-14 NOTE — ED Provider Notes (Signed)
[facility] Provider Note: Georgena Spurling, MD, FACEP  CSN: [id number] MRN: [id number] ARRIVAL: 02/13/20 at 2152 ROOM: [place]  Leg Swelling   HISTORY OF PRESENT ILLNESS  02/14/20 3:31 AM Deanna Mcdonald is a 49 y.o. female with a sensation of swelling or tightness in her left leg.  She describes associated discomfort as feeling like there is a balloon inside her left leg.  She rates this as a 5 out of 10.  Nothing makes it better or worse.  She did not injure her leg.  She is not on exogenous estrogen, does not smoke, and has not had recent travel.   Past Medical History:  Diagnosis Date  . ADHD   . Anxiety   . Depression   . Narcolepsy   . Narcolepsy     Past Surgical History:  Procedure Laterality Date  . BREAST BIOPSY    . CESAREAN SECTION    . COCHLEAR IMPLANT      Family History  Problem Relation Age of Onset  . Breast cancer Maternal Grandmother 63  . Diabetes Father   . Thyroid disease Brother   . Stroke Paternal Grandmother   . Thyroid disease Maternal Aunt     Social History   Tobacco Use  . Smoking status: Never Smoker  . Smokeless tobacco: Never Used  Vaping Use  . Vaping Use: Never used  Substance Use Topics  . Alcohol use: Yes  . Drug use: Never    Prior to Admission medications   Medication Sig Start Date End Date Taking? Authorizing Provider  albuterol (PROVENTIL HFA;VENTOLIN HFA) 108 (90 BASE) MCG/ACT inhaler Inhale 2 puffs into the lungs every 6 (six) hours as needed.    [provider]  fexofenadine (ALLEGRA) 180 MG tablet Take by mouth.    [provider]  levonorgestrel (MIRENA) 20 MCG/24HR IUD 1 each by Intrauterine route once.    [provider]  traZODone (DESYREL) 100 MG tablet TK 1/2 TO 1 T PO A COUPLE H B BED 12/04/17   [provider]  valACYclovir (VALTREX) 1000 MG tablet Take by mouth. 08/06/17   [provider]  venlafaxine (EFFEXOR) 37.5 MG tablet  Take 1 tablet (37.5 mg total) by mouth 2 (two) times daily. 01/08/20   Merian Capron, MD  gabapentin (NEURONTIN) 300 MG capsule  01/15/18 11/27/19  [provider]  lisdexamfetamine (VYVANSE) 10 MG capsule Take 10 mg by mouth daily.  11/27/19  [provider]    Allergies Zolpidem, Bupropion, Ibuprofen, Latex, Eggs or egg-derived products, and Prednisone   REVIEW OF SYSTEMS  Negative except as noted here or in the History of Present Illness.   PHYSICAL EXAMINATION  Initial Vital Signs Blood pressure (!) 141/83, pulse 71, temperature 98.4 F (36.9 C), temperature source Oral, resp. rate 18, height 5\' 2"  (1.575 m), weight 81.6 kg, SpO2 100 %.  Examination General: Well-developed, well-nourished female in no acute distress; appearance consistent with age of record HENT: normocephalic; atraumatic Eyes: pupils equal, round and reactive to light; extraocular muscles intact Neck: supple Heart: regular rate and rhythm Lungs: clear to auscultation bilaterally Abdomen: soft; nondistended; nontender; bowel sounds present Extremities: No deformity; full range of motion; pulses normal; trace edema both lower legs; calf diameter 16 inches bilaterally Neurologic: Awake, alert and oriented; motor function intact in all extremities and symmetric; no facial droop Skin: Warm and dry Psychiatric: Normal mood and affect   RESULTS  Summary of this visit's results, reviewed  and interpreted by myself:   EKG Interpretation  Date/Time:    Ventricular Rate:    PR Interval:    QRS Duration:   QT Interval:    QTC Calculation:   R Axis:     Text Interpretation:        Laboratory Studies: Results for orders placed or performed during the hospital encounter of 02/14/20 (from the past 24 hour(s))  D-dimer, quantitative (not at [facility])     Status: Abnormal   Collection Time: 02/14/20  3:57 AM  Result Value Ref Range   D-Dimer, Quant 0.71 (H) 0.00 - 0.50 ug/mL-FEU   Imaging  Studies: No results found.  ED COURSE and MDM  Nursing notes, initial and subsequent vitals signs, including pulse oximetry, reviewed and interpreted by myself.  Vitals:   02/13/20 2203 02/13/20 2204 02/14/20 0336  BP:  (!) 141/83 102/65  Pulse:  71 65  Resp:  18 18  Temp:  98.4 F (36.9 C)   TempSrc:  Oral   SpO2:  100% 100%  Weight: 81.6 kg    Height: 5\' 2"  (1.575 m)     Medications  rivaroxaban (XARELTO) tablet 20 mg (has no administration in time range)    Patient's D-dimer is elevated.  We will give her a dose of Xarelto and have her return later this morning for a venous Doppler to evaluate for DVT.  PROCEDURES  Procedures   ED DIAGNOSES     ICD-10-CM   1. Suspected deep vein thrombosis (DVT)  R09.89        Shanon Rosser, MD 02/14/20 [phone]

## 2020-02-21 ENCOUNTER — Other Ambulatory Visit (HOSPITAL_COMMUNITY): Payer: Self-pay | Admitting: Psychiatry

## 2020-02-24 ENCOUNTER — Telehealth (HOSPITAL_COMMUNITY): Payer: 59 | Admitting: Psychiatry

## 2020-02-26 ENCOUNTER — Telehealth (INDEPENDENT_AMBULATORY_CARE_PROVIDER_SITE_OTHER): Payer: 59 | Admitting: Psychiatry

## 2020-02-26 ENCOUNTER — Encounter (HOSPITAL_COMMUNITY): Payer: Self-pay | Admitting: Psychiatry

## 2020-02-26 DIAGNOSIS — Z9119 Patient's noncompliance with other medical treatment and regimen: Secondary | ICD-10-CM

## 2020-02-26 DIAGNOSIS — F331 Major depressive disorder, recurrent, moderate: Secondary | ICD-10-CM | POA: Diagnosis not present

## 2020-02-26 DIAGNOSIS — Z91199 Patient's noncompliance with other medical treatment and regimen due to unspecified reason: Secondary | ICD-10-CM
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
## 2020-04-13 ENCOUNTER — Other Ambulatory Visit: Payer: Self-pay | Admitting: Family Medicine

## 2020-04-13 DIAGNOSIS — Z1231 Encounter for screening mammogram for malignant neoplasm of breast: Secondary | ICD-10-CM

## 2020-04-22 ENCOUNTER — Other Ambulatory Visit: Payer: Self-pay

## 2020-04-22 ENCOUNTER — Ambulatory Visit (INDEPENDENT_AMBULATORY_CARE_PROVIDER_SITE_OTHER): Payer: 59

## 2020-04-22 DIAGNOSIS — Z1231 Encounter for screening mammogram for malignant neoplasm of breast: Secondary | ICD-10-CM

## 2020-04-27 ENCOUNTER — Encounter: Payer: Self-pay | Admitting: Internal Medicine

## 2020-04-27 ENCOUNTER — Telehealth (HOSPITAL_COMMUNITY): Payer: 59 | Admitting: Psychiatry

## 2020-04-27 ENCOUNTER — Other Ambulatory Visit: Payer: Self-pay

## 2020-04-27 ENCOUNTER — Ambulatory Visit: Payer: 59 | Admitting: Internal Medicine

## 2020-04-27 VITALS — BP 108/70 | HR 78 | Temp 98.8°F | Ht 62.0 in | Wt 183.1 lb

## 2020-04-27 DIAGNOSIS — F339 Major depressive disorder, recurrent, unspecified: Secondary | ICD-10-CM | POA: Diagnosis not present

## 2020-04-27 DIAGNOSIS — E069 Thyroiditis, unspecified: Secondary | ICD-10-CM

## 2020-04-27 DIAGNOSIS — Z1211 Encounter for screening for malignant neoplasm of colon: Secondary | ICD-10-CM | POA: Diagnosis not present

## 2020-04-27 DIAGNOSIS — F411 Generalized anxiety disorder: Secondary | ICD-10-CM | POA: Diagnosis not present

## 2020-04-27 DIAGNOSIS — R440 Auditory hallucinations: Secondary | ICD-10-CM | POA: Diagnosis not present

## 2020-04-27 DIAGNOSIS — M7122 Synovial cyst of popliteal space [Baker], left knee: Secondary | ICD-10-CM

## 2020-04-27 DIAGNOSIS — Z23 Encounter for immunization: Secondary | ICD-10-CM

## 2020-04-27 DIAGNOSIS — E66811 Obesity, class 1: Secondary | ICD-10-CM

## 2020-04-27 DIAGNOSIS — E669 Obesity, unspecified: Secondary | ICD-10-CM

## 2020-04-27 NOTE — Progress Notes (Signed)
New Patient Office Visit     This visit occurred during the SARS-CoV-2 public health emergency.  Safety protocols were in place, including screening questions prior to the visit, additional usage of staff PPE, and extensive cleaning of exam room while observing appropriate contact time as indicated for disinfecting solutions.    CC/Reason for Visit: Establish care, discuss chronic medical conditions Previous PCP: Adline Mango, MD Last Visit: Unknown  HPI: Deanna Mcdonald is a 49 y.o. female who is coming in today for the above mentioned reasons. Past Medical History is significant for: Longstanding history of depression/general anxiety disorder since approximately 2003.  From her description it appears that she might have received electroconvulsive therapy over the spring.  She had been on Effexor and a multitude of other drugs but she decided to take herself off over the summer.  She tells me that her psychiatrist had disagreed with this decision.  At times she hears voices.  These voices are not asking her to harm herself.  She has decided to take care of her physical health to see if she can avoid going back on psychiatric medications.  She states that at some point she has been diagnosed with thyroiditis but has never been on medications.  She was diagnosed with a incidental left Baker's cyst earlier this year, this has not caused her any issues.  She has never been a smoker, she drinks alcohol rarely and occasionally.  Her past surgical history is significant for C-section x3 and a tympanoplasty in 2014.  She has 5 children, 4 live locally.  Her family history significant for a paternal grandmother with coronary artery disease and a stroke.   Past Medical/Surgical History: Past Medical History:  Diagnosis Date  . ADHD   . Anxiety   . Depression   . Narcolepsy   . Narcolepsy     Past Surgical History:  Procedure Laterality Date  . BREAST BIOPSY    . CESAREAN SECTION    . COCHLEAR  IMPLANT      Social History:  reports that she has never smoked. She has never used smokeless tobacco. She reports current alcohol use. She reports that she does not use drugs.  Allergies: Allergies  Allergen Reactions  . Zolpidem     Thoughts of suicide Thoughts of suicide   . Bupropion Nausea Only    Ringing in ears, chest tightness  . Ibuprofen Hives  . Latex Hives  . Eggs Or Egg-Derived Products Nausea And Vomiting  . Prednisone Anxiety    Family History:  Family History  Problem Relation Age of Onset  . Breast cancer Maternal Grandmother 50  . Diabetes Father   . Thyroid disease Brother   . Stroke Paternal Grandmother   . Thyroid disease Maternal Aunt      Current Outpatient Medications:  .  5-Hydroxytryptophan (5-HTP) 50 MG TABS, Take by mouth., Disp: , Rfl:  .  Cholecalciferol (D3-1000 PO), Take by mouth., Disp: , Rfl:  .  fexofenadine (ALLEGRA) 180 MG tablet, Take by mouth., Disp: , Rfl:  .  levonorgestrel (MIRENA) 20 MCG/24HR IUD, 1 each by Intrauterine route once., Disp: , Rfl:  .  Magnesium 250 MG TABS, Take by mouth., Disp: , Rfl:  .  melatonin 3 MG TABS tablet, Take 3 mg by mouth at bedtime., Disp: , Rfl:  .  naproxen (NAPROSYN) 500 MG tablet, Take 500 mg by mouth 2 (two) times daily with a meal., Disp: , Rfl:  .  OVER THE  COUNTER MEDICATION, L - Theanine 200 mg, Disp: , Rfl:  .  OVER THE COUNTER MEDICATION, Smarty pants PHD - womens 100mg , Disp: , Rfl:  .  pseudoephedrine (SUDAFED) 30 MG tablet, Take 30 mg by mouth every 4 (four) hours as needed for congestion., Disp: , Rfl:  .  valACYclovir (VALTREX) 1000 MG tablet, Take by mouth. (Patient not taking: Reported on 04/27/2020), Disp: , Rfl:   Review of Systems:  Constitutional: Denies fever, chills, diaphoresis, appetite change and fatigue.  HEENT: Denies photophobia, eye pain, redness, hearing loss, ear pain, congestion, sore throat, rhinorrhea, sneezing, mouth sores, trouble swallowing, neck pain, neck  stiffness and tinnitus.   Respiratory: Denies SOB, DOE, cough, chest tightness,  and wheezing.   Cardiovascular: Denies chest pain, palpitations and leg swelling.  Gastrointestinal: Denies nausea, vomiting, abdominal pain, diarrhea, constipation, blood in stool and abdominal distention.  Genitourinary: Denies dysuria, urgency, frequency, hematuria, flank pain and difficulty urinating.  Endocrine: Denies: hot or cold intolerance, sweats, changes in hair or nails, polyuria, polydipsia. Musculoskeletal: Denies myalgias, back pain, joint swelling, arthralgias and gait problem.  Skin: Denies pallor, rash and wound.  Neurological: Denies dizziness, seizures, syncope, weakness, light-headedness, numbness and headaches.  Hematological: Denies adenopathy. Easy bruising, personal or family bleeding history  Psychiatric/Behavioral: Denies suicidal ideation,  confusion, nervousness, sleep disturbance and agitation    Physical Exam: Vitals:   04/27/20 1443  BP: 108/70  Pulse: 78  Temp: 98.8 F (37.1 C)  TempSrc: Oral  SpO2: 97%  Weight: 183 lb 1.6 oz (83.1 kg)  Height: 5\' 2"  (1.575 m)   Body mass index is 33.49 kg/m.  Constitutional: NAD, calm, comfortable, obese Eyes: PERRL, lids and conjunctivae normal, wears corrective lenses ENMT: Mucous membranes are moist.  Respiratory: clear to auscultation bilaterally, no wheezing, no crackles. Normal respiratory effort. No accessory muscle use.  Cardiovascular: Regular rate and rhythm, no murmurs / rubs / gallops. No extremity edema.  Neurologic: Grossly intact and nonfocal    Impression and Plan:  Depression, recurrent (HCC)  GAD (generalized anxiety disorder) Auditory hallucination   -I will check multiple labs today to exclude infection, thyroid abnormalities, vitamin deficiencies that could be playing into her mood issues and hallucinations including complete CBC, CMP, TSH, vitamin B12, vitamin D, I will also check a UA and culture to rule  out urine infection. -I have advised that she schedule follow-up as soon as possible with her psychiatrist for medication management of her mental illnesses.  Screening for malignant neoplasm of colon  - Plan: Ambulatory referral to Gastroenterology  Thyroiditis  - Plan: TSH  Baker's cyst of knee, left -Incidental finding, not causing any pain or edema.  Need for influenza vaccination -Flu vaccine administered today  Obesity (BMI 30.0-34.9) -Discussed healthy lifestyle, including increased physical activity and better food choices to promote weight loss.     Patient Instructions  -Nice seeing you today!!  -Lab work today; will notify you once results are available.  -Flu vaccine.  -Please schedule follow up with psychiatry as soon as possible.  -Schedule follow up in 4 months for your physical. Please come in fasting.     Lelon Frohlich, MD [facility]

## 2020-04-27 NOTE — Patient Instructions (Signed)
-  Nice seeing you today!!  -Lab work today; will notify you once results are available.  -Flu vaccine.  -Please schedule follow up with psychiatry as soon as possible.  -Schedule follow up in 4 months for your physical. Please come in fasting.

## 2020-04-28 LAB — URINALYSIS, ROUTINE W REFLEX MICROSCOPIC
Bilirubin Urine: NEGATIVE
Glucose, UA: NEGATIVE
Hgb urine dipstick: NEGATIVE
Ketones, ur: NEGATIVE
Leukocytes,Ua: NEGATIVE
Nitrite: NEGATIVE
Protein, ur: NEGATIVE
Specific Gravity, Urine: 1.009 (ref 1.001–1.03)
pH: 8 (ref 5.0–8.0)

## 2020-04-28 LAB — COMPREHENSIVE METABOLIC PANEL
AG Ratio: 1.9 (calc) (ref 1.0–2.5)
ALT: 9 U/L (ref 6–29)
AST: 15 U/L (ref 10–35)
Albumin: 4.2 g/dL (ref 3.6–5.1)
Alkaline phosphatase (APISO): 44 U/L (ref 31–125)
BUN: 13 mg/dL (ref 7–25)
CO2: 29 mmol/L (ref 20–32)
Calcium: 9.2 mg/dL (ref 8.6–10.2)
Chloride: 106 mmol/L (ref 98–110)
Creat: 0.77 mg/dL (ref 0.50–1.10)
Globulin: 2.2 g/dL (calc) (ref 1.9–3.7)
Glucose, Bld: 79 mg/dL (ref 65–99)
Potassium: 4.6 mmol/L (ref 3.5–5.3)
Sodium: 140 mmol/L (ref 135–146)
Total Bilirubin: 0.4 mg/dL (ref 0.2–1.2)
Total Protein: 6.4 g/dL (ref 6.1–8.1)

## 2020-04-28 LAB — CBC WITH DIFFERENTIAL/PLATELET
Absolute Monocytes: 352 cells/uL (ref 200–950)
Basophils Absolute: 20 cells/uL (ref 0–200)
Basophils Relative: 0.4 %
Eosinophils Absolute: 51 cells/uL (ref 15–500)
Eosinophils Relative: 1 %
HCT: 38 % (ref 35.0–45.0)
Hemoglobin: 12.7 g/dL (ref 11.7–15.5)
Lymphs Abs: 1663 cells/uL (ref 850–3900)
MCH: 30.2 pg (ref 27.0–33.0)
MCHC: 33.4 g/dL (ref 32.0–36.0)
MCV: 90.3 fL (ref 80.0–100.0)
MPV: 11.8 fL (ref 7.5–12.5)
Monocytes Relative: 6.9 %
Neutro Abs: 3014 cells/uL (ref 1500–7800)
Neutrophils Relative %: 59.1 %
Platelets: 213 10*3/uL (ref 140–400)
RBC: 4.21 10*6/uL (ref 3.80–5.10)
RDW: 12.3 % (ref 11.0–15.0)
Total Lymphocyte: 32.6 %
WBC: 5.1 10*3/uL (ref 3.8–10.8)

## 2020-04-28 LAB — VITAMIN D 25 HYDROXY (VIT D DEFICIENCY, FRACTURES): Vit D, 25-Hydroxy: 37 ng/mL (ref 30–100)

## 2020-04-28 LAB — TSH: TSH: 1.29 mIU/L

## 2020-04-28 LAB — VITAMIN B12: Vitamin B-12: 456 pg/mL (ref 200–1100)

## 2020-04-28 LAB — URINE CULTURE
MICRO NUMBER:: 11179778
SPECIMEN QUALITY:: ADEQUATE

## 2020-04-29 ENCOUNTER — Telehealth (INDEPENDENT_AMBULATORY_CARE_PROVIDER_SITE_OTHER): Payer: 59 | Admitting: Psychiatry

## 2020-04-29 ENCOUNTER — Encounter (HOSPITAL_COMMUNITY): Payer: Self-pay | Admitting: Psychiatry

## 2020-04-29 ENCOUNTER — Encounter: Payer: Self-pay | Admitting: Internal Medicine

## 2020-04-29 ENCOUNTER — Other Ambulatory Visit: Payer: Self-pay | Admitting: Internal Medicine

## 2020-04-29 DIAGNOSIS — F331 Major depressive disorder, recurrent, moderate: Secondary | ICD-10-CM

## 2020-04-29 DIAGNOSIS — Z91199 Patient's noncompliance with other medical treatment and regimen due to unspecified reason: Secondary | ICD-10-CM

## 2020-04-29 DIAGNOSIS — Z9119 Patient's noncompliance with other medical treatment and regimen: Secondary | ICD-10-CM | POA: Diagnosis not present

## 2020-04-29 DIAGNOSIS — E559 Vitamin D deficiency, unspecified: Secondary | ICD-10-CM | POA: Insufficient documentation

## 2020-04-29 MED ORDER — VITAMIN D (ERGOCALCIFEROL) 1.25 MG (50000 UNIT) PO CAPS: 50000.0000 [IU] | ORAL_CAPSULE | ORAL | 0 refills | Status: DC
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
## 2020-05-03 ENCOUNTER — Other Ambulatory Visit: Payer: Self-pay | Admitting: Internal Medicine

## 2020-05-03 DIAGNOSIS — E559 Vitamin D deficiency, unspecified: Secondary | ICD-10-CM

## 2020-05-04 ENCOUNTER — Telehealth (HOSPITAL_COMMUNITY): Payer: Self-pay | Admitting: Psychiatry

## 2020-05-04 NOTE — Telephone Encounter (Signed)
L/m for patient to call back

## 2020-05-04 NOTE — Telephone Encounter (Signed)
She should get a Retail banker as well to work on her concerns Need to know what her job responsibilities or job would be first

## 2020-05-04 NOTE — Telephone Encounter (Signed)
Patient would like a letter for her future employer stating her diagnosis and any accomodation she might need.   Please advise.   CB [phone]

## 2020-05-17 ENCOUNTER — Telehealth: Payer: Self-pay | Admitting: Internal Medicine

## 2020-05-17 NOTE — Telephone Encounter (Signed)
Patient is returning the call for labs, please advise. CB is [phone]

## 2020-05-19 NOTE — Telephone Encounter (Signed)
Left message on machine to return our call. 

## 2020-05-19 NOTE — Telephone Encounter (Signed)
Patient returned Rachel's call for lab results

## 2020-05-27 NOTE — Telephone Encounter (Signed)
Message sent to Mychart

## 2020-06-04 ENCOUNTER — Other Ambulatory Visit (HOSPITAL_COMMUNITY)
Admission: RE | Admit: 2020-06-04 | Discharge: 2020-06-04 | Disposition: A | Discharge status: Home / Self Care | Payer: 59 | Source: Ambulatory Visit | Attending: Obstetrics & Gynecology | Admitting: Obstetrics & Gynecology

## 2020-06-04 ENCOUNTER — Ambulatory Visit (INDEPENDENT_AMBULATORY_CARE_PROVIDER_SITE_OTHER): Payer: 59 | Admitting: Obstetrics & Gynecology

## 2020-06-04 ENCOUNTER — Encounter: Payer: Self-pay | Admitting: Obstetrics & Gynecology

## 2020-06-04 ENCOUNTER — Other Ambulatory Visit: Payer: Self-pay

## 2020-06-04 VITALS — BP 112/69 | HR 77 | Ht 62.0 in | Wt 182.1 lb

## 2020-06-04 DIAGNOSIS — Z01419 Encounter for gynecological examination (general) (routine) without abnormal findings: Secondary | ICD-10-CM | POA: Diagnosis present

## 2020-06-04 DIAGNOSIS — D219 Benign neoplasm of connective and other soft tissue, unspecified: Secondary | ICD-10-CM | POA: Diagnosis not present

## 2020-06-04 DIAGNOSIS — Z1211 Encounter for screening for malignant neoplasm of colon: Secondary | ICD-10-CM | POA: Diagnosis not present

## 2020-06-04 DIAGNOSIS — R32 Unspecified urinary incontinence: Secondary | ICD-10-CM

## 2020-06-07 ENCOUNTER — Encounter: Payer: Self-pay | Admitting: Obstetrics & Gynecology

## 2020-06-07 LAB — CYTOLOGY - PAP
Comment: NEGATIVE
Diagnosis: NEGATIVE
High risk HPV: NEGATIVE

## 2020-06-07 NOTE — Progress Notes (Signed)
Subjective:     Deanna Mcdonald is a 49 y.o. female here for a routine exam.  Current complaints:Pt reports freq leakage of urine with valsalva or exercise. She does not drink caffienated liquids.      Gynecologic History No LMP recorded. (Menstrual status: IUD). Contraception: Last Pap: 01/30/2018. Results were: normal Last mammogram: 04/22/2020. Results were: normal  Obstetric History OB History  Gravida Para Term Preterm AB Living  8 5 5  0 3 5  SAB IAB Ectopic Multiple Live Births  2 1 0 0 0    # Outcome Date GA Lbr Len/2nd Weight Sex Delivery Anes PTL Lv  8 IAB           7 SAB           6 SAB           5 Term           4 Term           3 Term           2 Term           1 Term              The following portions of the patient's history were reviewed and updated as appropriate: allergies, current medications, past family history, past medical history, past social history, past surgical history and problem list.  Review of Systems Pertinent items are noted in HPI.    Objective:  BP 112/69   Pulse 77   Ht 5\' 2"  (1.575 m)   Wt 182 lb 1.3 oz (82.6 kg)   BMI 33.30 kg/m   General Appearance:    Alert, cooperative, no distress, appears stated age  Head:    Normocephalic, without obvious abnormality, atraumatic  Eyes:    conjunctiva/corneas clear, EOM's intact, both eyes  Ears:    Normal external ear canals, both ears  Nose:   Nares normal, septum midline, mucosa normal, no drainage    or sinus tenderness  Throat:   Lips, mucosa, and tongue normal; teeth and gums normal  Neck:   Supple, symmetrical, trachea midline, no adenopathy;    thyroid:  no enlargement/tenderness/nodules  Back:     Symmetric, no curvature, ROM normal, no CVA tenderness  Lungs:     respirations unlabored  Chest Wall:    No tenderness or deformity   Heart:    Regular rate and rhythm  Breast Exam:    No tenderness, masses, or nipple abnormality  Abdomen:     Soft, non-tender, bowel sounds active all  four quadrants,    no masses, no organomegaly  Genitalia:    Normal female without lesion, discharge or tenderness     Extremities:   Extremities normal, atraumatic, no cyanosis or edema  Pulses:   2+ and symmetric all extremities  Skin:   Skin color, texture, turgor normal, no rashes or lesions    Assessment:    Healthy female exam.   Stress urinary incontinence.    Plan:  Diagnoses and all orders for this visit:  Urinary incontinence, unspecified type -     Ambulatory referral to Physical Therapy  Fibroids -     US PELVIS TRANSVAGINAL NON-OB (TV ONLY); Future  Well woman exam with routine gynecological exam -     Cytology - PAP( [place])  Colon cancer screening -     Ambulatory referral to Gastroenterology  f/u in 3 months or sooner prn   Breauna Mazzeo L. Harraway-Smith,  M.D., [name]

## 2020-06-22 ENCOUNTER — Ambulatory Visit (HOSPITAL_BASED_OUTPATIENT_CLINIC_OR_DEPARTMENT_OTHER): Payer: 59

## 2020-07-02 ENCOUNTER — Ambulatory Visit (HOSPITAL_BASED_OUTPATIENT_CLINIC_OR_DEPARTMENT_OTHER): Payer: 59

## 2020-07-08 ENCOUNTER — Encounter: Payer: Self-pay | Admitting: Internal Medicine

## 2020-07-19 ENCOUNTER — Ambulatory Visit: Payer: 59 | Admitting: Obstetrics & Gynecology

## 2020-07-21 ENCOUNTER — Other Ambulatory Visit: Payer: Self-pay | Admitting: Internal Medicine

## 2020-07-21 DIAGNOSIS — E559 Vitamin D deficiency, unspecified: Secondary | ICD-10-CM

## 2020-07-22 ENCOUNTER — Encounter (HOSPITAL_COMMUNITY): Payer: Self-pay | Admitting: Psychiatry

## 2020-07-22 ENCOUNTER — Telehealth (INDEPENDENT_AMBULATORY_CARE_PROVIDER_SITE_OTHER): Payer: 59 | Admitting: Psychiatry

## 2020-07-22 DIAGNOSIS — Z9119 Patient's noncompliance with other medical treatment and regimen: Secondary | ICD-10-CM | POA: Diagnosis not present

## 2020-07-22 DIAGNOSIS — F331 Major depressive disorder, recurrent, moderate: Secondary | ICD-10-CM

## 2020-07-22 DIAGNOSIS — Z91199 Patient's noncompliance with other medical treatment and regimen due to unspecified reason: Secondary | ICD-10-CM
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
## 2020-07-27 ENCOUNTER — Telehealth (HOSPITAL_COMMUNITY): Payer: 59 | Admitting: Psychiatry

## 2020-07-31 IMAGING — MG DIGITAL SCREENING BILATERAL MAMMOGRAM WITH TOMO AND CAD
6 of 12 series · 6 of 36 positions shown · non-contrast
Comparison: Previous exam(s).

CLINICAL DATA: Screening.

EXAM:
DIGITAL SCREENING BILATERAL MAMMOGRAM WITH TOMO AND CAD

[R CC synth-2D]
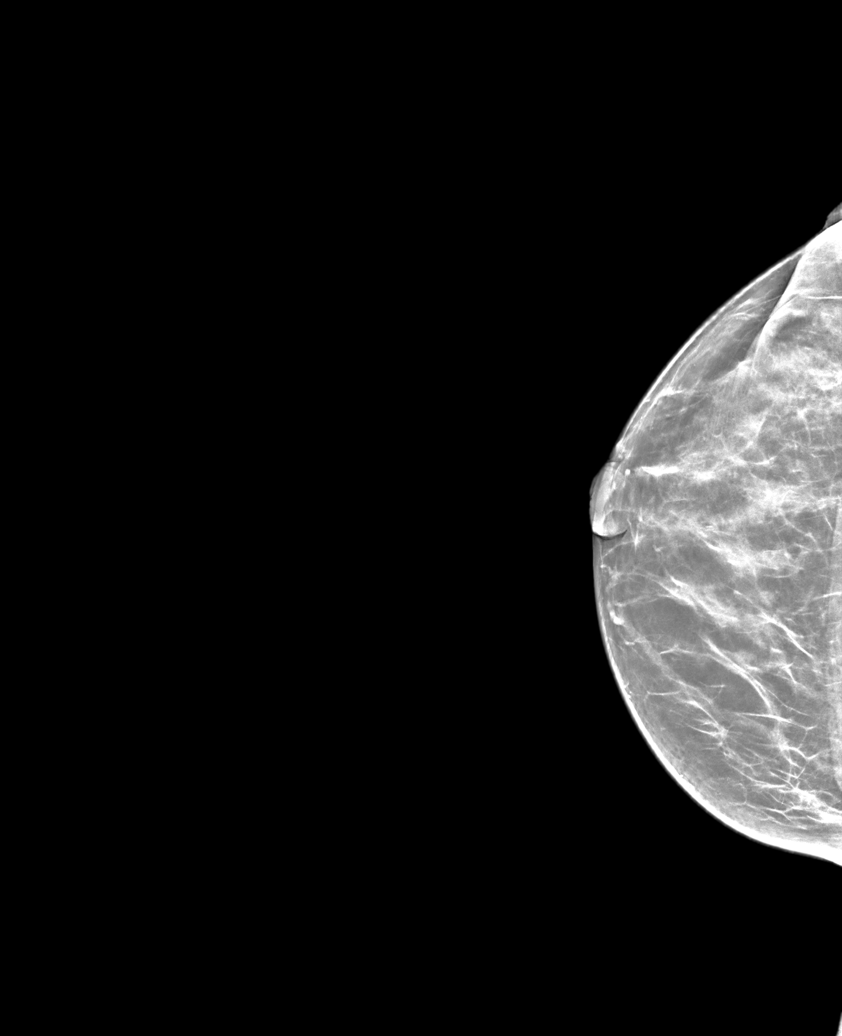

[L MLO synth-2D]
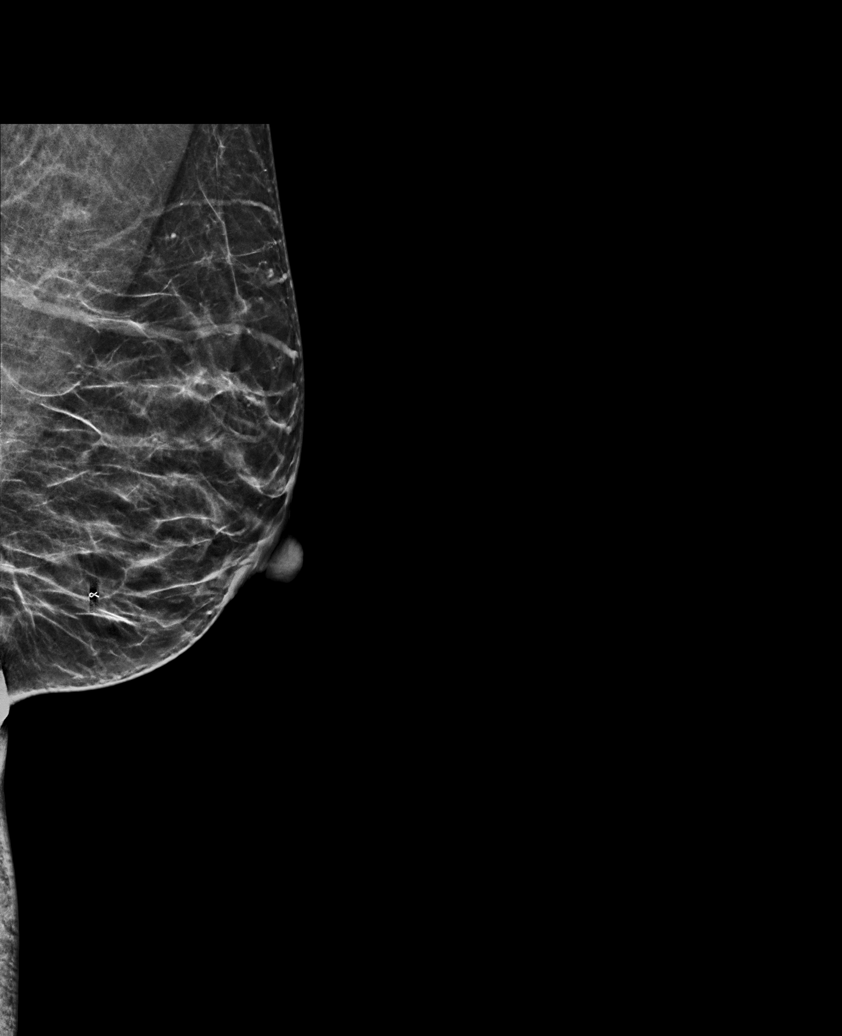

[L CC synth-2D]
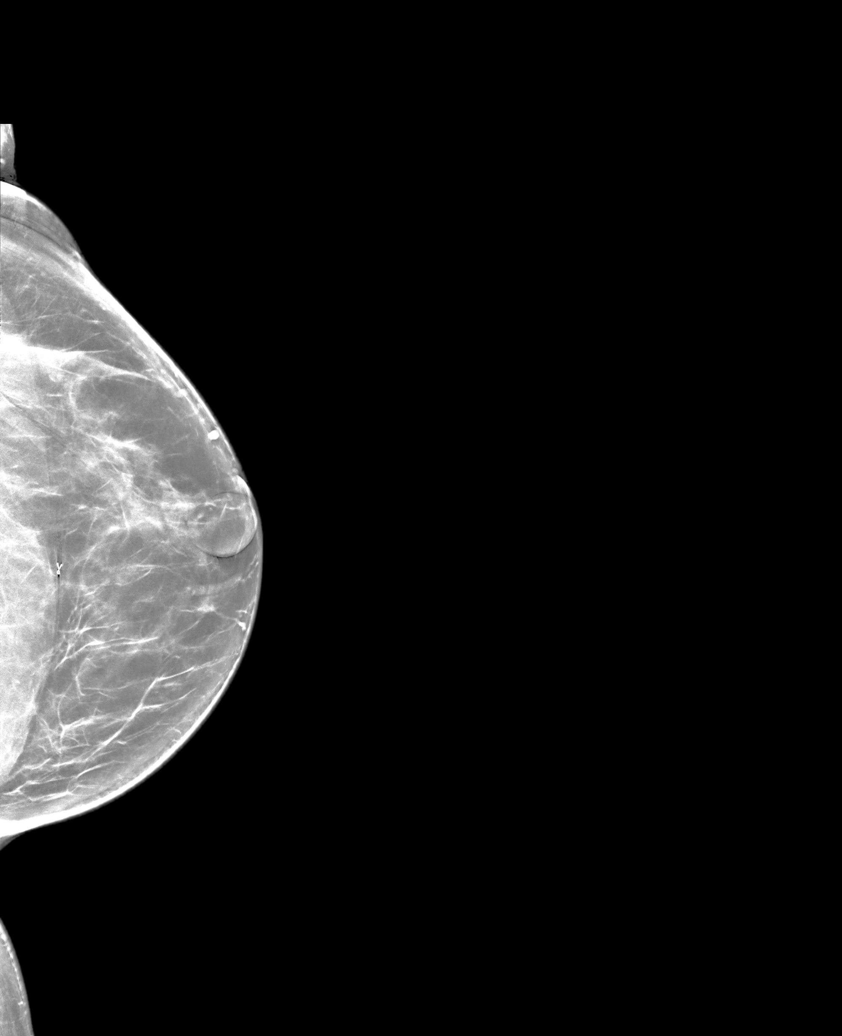

[L XCCL synth-2D]
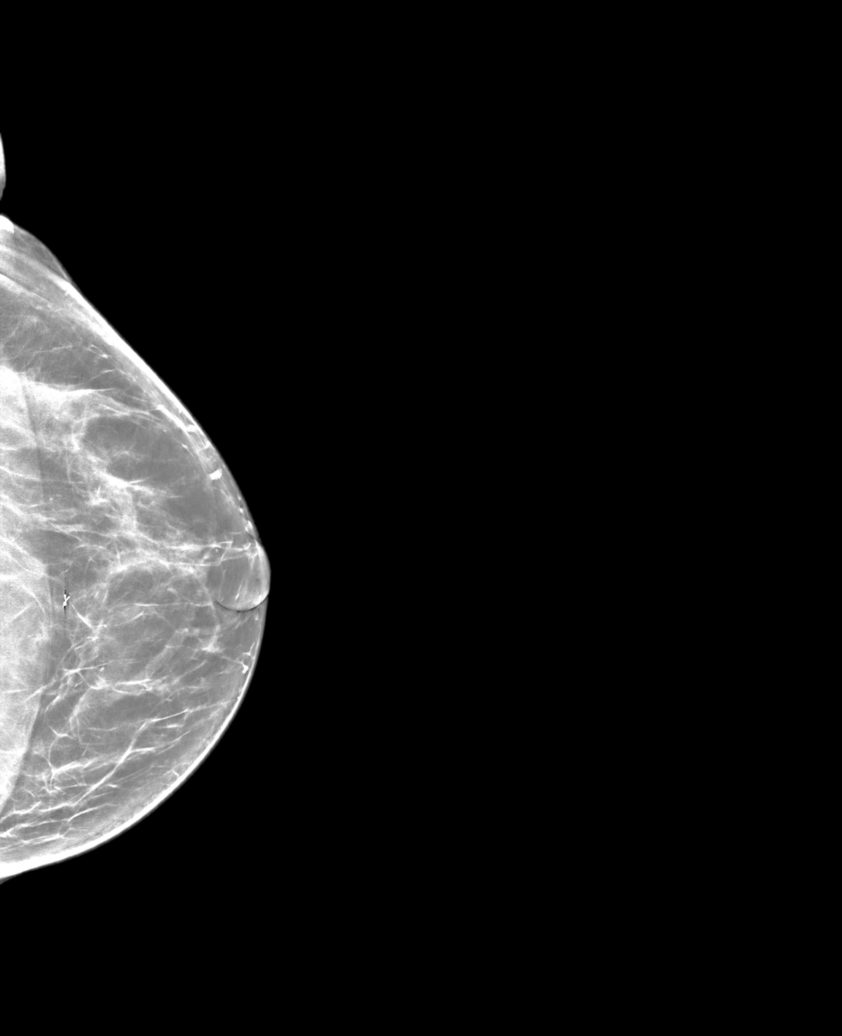

[R MLO synth-2D]
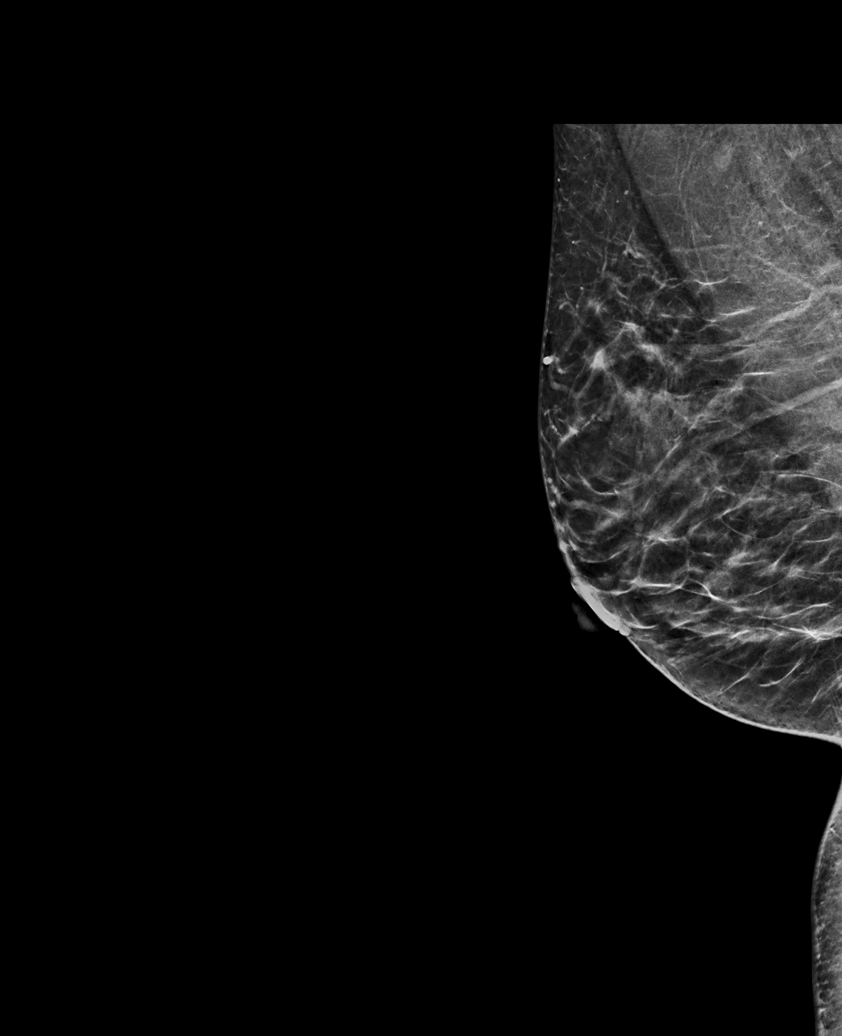

[R XCCL synth-2D]
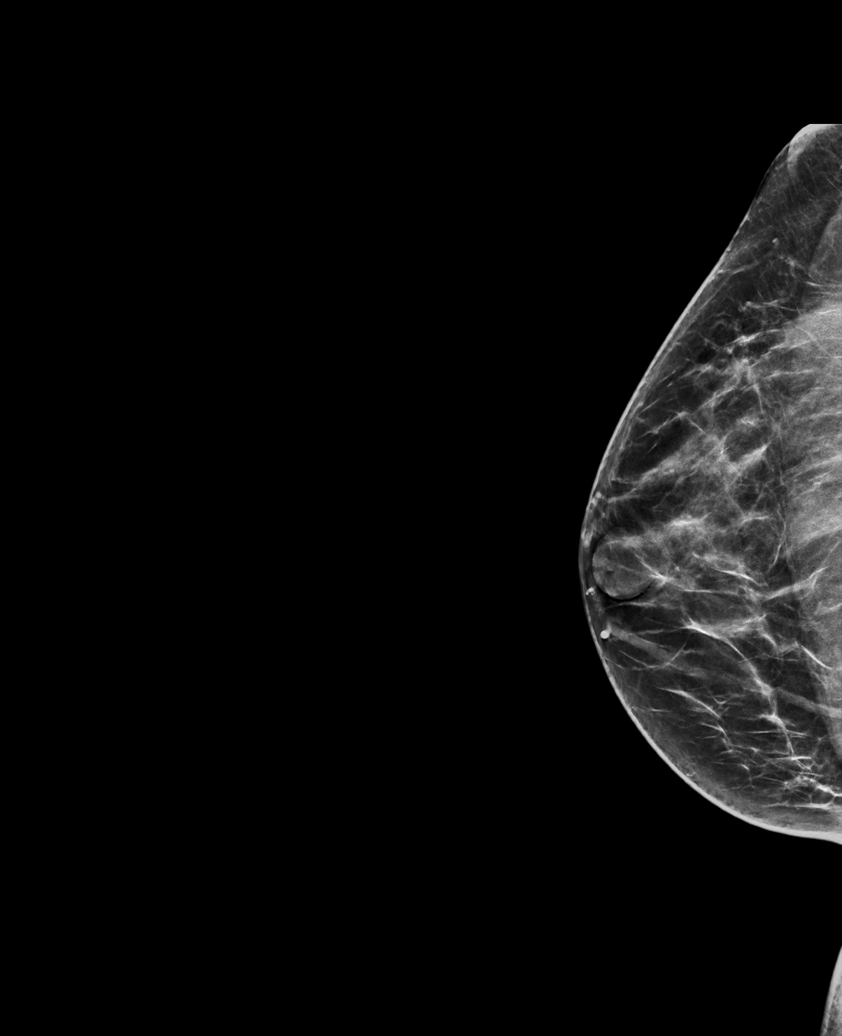

[6 of 36 positions shown; findings below may reference images not displayed]

ACR Breast Density Category c: The breast tissue is heterogeneously
dense, which may obscure small masses.
FINDINGS: There are no findings suspicious for malignancy. Images were
processed with CAD.
IMPRESSION: No mammographic evidence of malignancy. A result letter of this
screening mammogram will be mailed directly to the patient.

RECOMMENDATION:
Screening mammogram in one year. (Code:[id number])

BI-RADS CATEGORY  1: Negative.

## 2020-08-12 ENCOUNTER — Encounter: Payer: 59 | Admitting: Gastroenterology

## 2020-08-23 ENCOUNTER — Telehealth (HOSPITAL_COMMUNITY): Payer: 59 | Admitting: Psychiatry

## 2020-09-07 ENCOUNTER — Telehealth (INDEPENDENT_AMBULATORY_CARE_PROVIDER_SITE_OTHER): Payer: 59 | Admitting: Internal Medicine

## 2020-09-07 ENCOUNTER — Telehealth (HOSPITAL_COMMUNITY): Payer: 59 | Admitting: Psychiatry

## 2020-09-07 DIAGNOSIS — G479 Sleep disorder, unspecified: Secondary | ICD-10-CM | POA: Diagnosis not present

## 2020-09-07 NOTE — Progress Notes (Signed)
Virtual Visit via Video Note  I connected with Deanna Mcdonald on 09/07/20 at  4:00 PM EDT by a video enabled telemedicine application and verified that I am speaking with the correct person using two identifiers.  Location patient: home Location provider: work office Persons participating in the virtual visit: patient, provider  I discussed the limitations of evaluation and management by telemedicine and the availability of in person appointments. The patient expressed understanding and agreed to proceed.   HPI: She has scheduled this visit today because she would like to be referred for a sleep study and wanted my opinion.  She tells me that she snores every night.  She is very fatigued during the daytime and has noticed a pattern of increasing daytime naps.  She wakes up startled at nighttime.  Feels like she does not have good quality sleep.  She has no issues falling asleep, main issue is staying asleep.  She tells me that around 2000 or 2005 [place]; she thinks it was mentioned that she had narcolepsy but I have no further information about this.  She tells me that no CPAP was prescribed at that time.   ROS: Constitutional: Denies fever, chills, diaphoresis, appetite change and fatigue.  HEENT: Denies photophobia, eye pain, redness, hearing loss, ear pain, congestion, sore throat, rhinorrhea, sneezing, mouth sores, trouble swallowing, neck pain, neck stiffness and tinnitus.   Respiratory: Denies SOB, DOE, cough, chest tightness,  and wheezing.   Cardiovascular: Denies chest pain, palpitations and leg swelling.  Gastrointestinal: Denies nausea, vomiting, abdominal pain, diarrhea, constipation, blood in stool and abdominal distention.  Genitourinary: Denies dysuria, urgency, frequency, hematuria, flank pain and difficulty urinating.  Endocrine: Denies: hot or cold intolerance, sweats, changes in hair or nails, polyuria, polydipsia. Musculoskeletal:  Denies myalgias, back pain, joint swelling, arthralgias and gait problem.  Skin: Denies pallor, rash and wound.  Neurological: Denies dizziness, seizures, syncope, weakness, light-headedness, numbness and headaches.  Hematological: Denies adenopathy. Easy bruising, personal or family bleeding history  Psychiatric/Behavioral: Denies suicidal ideation, mood changes, confusion, nervousness, sleep disturbance and agitation   Past Medical History:  Diagnosis Date  . ADHD   . Anxiety   . Depression   . Narcolepsy   . Narcolepsy     Past Surgical History:  Procedure Laterality Date  . BREAST BIOPSY    . CESAREAN SECTION    . COCHLEAR IMPLANT      Family History  Problem Relation Age of Onset  . Breast cancer Maternal Grandmother 52  . Diabetes Father   . Thyroid disease Brother   . Stroke Paternal Grandmother   . Thyroid disease Maternal Aunt     SOCIAL HX:   reports that she has never smoked. She has never used smokeless tobacco. She reports current alcohol use. She reports that she does not use drugs.   Current Outpatient Medications:  .  5-Hydroxytryptophan (5-HTP) 50 MG TABS, Take by mouth., Disp: , Rfl:  .  Cholecalciferol (D3-1000 PO), Take by mouth., Disp: , Rfl:  .  fexofenadine (ALLEGRA) 180 MG tablet, Take by mouth., Disp: , Rfl:  .  levonorgestrel (MIRENA) 20 MCG/24HR IUD, 1 each by Intrauterine route once., Disp: , Rfl:  .  Magnesium 250 MG TABS, Take by mouth., Disp: , Rfl:  .  melatonin 3 MG TABS tablet, Take 3 mg by mouth at bedtime., Disp: , Rfl:  .  naproxen (NAPROSYN) 500 MG tablet, Take 500 mg by mouth  2 (two) times daily with a meal., Disp: , Rfl:  .  OVER THE COUNTER MEDICATION, L - Theanine 200 mg (Patient not taking: Reported on 06/04/2020), Disp: , Rfl:  .  OVER THE COUNTER MEDICATION, Smarty pants PHD - womens 100mg , Disp: , Rfl:  .  pseudoephedrine (SUDAFED) 30 MG tablet, Take 30 mg by mouth every 4 (four) hours as needed for congestion., Disp: , Rfl:   .  valACYclovir (VALTREX) 1000 MG tablet, Take by mouth. (Patient not taking: No sig reported), Disp: , Rfl:  .  Vitamin D, Ergocalciferol, (DRISDOL) 1.25 MG (50000 UNIT) CAPS capsule, TAKE 1 CAPSULE BY MOUTH EVERY 7 DAYS FOR 12 DOSES, Disp: 12 capsule, Rfl: 0  EXAM:   VITALS per patient if applicable: None reported  GENERAL: alert, oriented, appears well and in no acute distress  HEENT: atraumatic, conjunttiva clear, no obvious abnormalities on inspection of external nose and ears  NECK: normal movements of the head and neck  LUNGS: on inspection no signs of respiratory distress, breathing rate appears normal, no obvious gross increased work of breathing, gasping or wheezing  CV: no obvious cyanosis  MS: moves all visible extremities without noticeable abnormality  PSYCH/NEURO: pleasant and cooperative, no obvious depression or anxiety, speech and thought processing grossly intact  ASSESSMENT AND PLAN:   Sleep disorder   -I think a sleep study is indicated given her symptoms.  I will go ahead and place a referral to [facility] neurology for sleep medicine.     I discussed the assessment and treatment plan with the patient. The patient was provided an opportunity to ask questions and all were answered. The patient agreed with the plan and demonstrated an understanding of the instructions.   The patient was advised to call back or seek an in-person evaluation if the symptoms worsen or if the condition fails to improve as anticipated.    Lelon Frohlich, MD  [facility]

## 2020-09-16 ENCOUNTER — Telehealth (HOSPITAL_COMMUNITY): Payer: 59 | Admitting: Psychiatry

## 2020-09-21 ENCOUNTER — Telehealth (HOSPITAL_COMMUNITY): Payer: 59 | Admitting: Psychiatry

## 2020-10-11 ENCOUNTER — Telehealth (INDEPENDENT_AMBULATORY_CARE_PROVIDER_SITE_OTHER): Payer: 59 | Admitting: Psychiatry

## 2020-10-11 ENCOUNTER — Encounter (HOSPITAL_COMMUNITY): Payer: Self-pay | Admitting: Psychiatry

## 2020-10-11 DIAGNOSIS — Z9119 Patient's noncompliance with other medical treatment and regimen: Secondary | ICD-10-CM | POA: Diagnosis not present

## 2020-10-11 DIAGNOSIS — F331 Major depressive disorder, recurrent, moderate: Secondary | ICD-10-CM

## 2020-10-11 DIAGNOSIS — Z91199 Patient's noncompliance with other medical treatment and regimen due to unspecified reason: Secondary | ICD-10-CM

## 2020-10-11 MED ORDER — LAMOTRIGINE 25 MG PO TABS: 25.0000 mg | ORAL_TABLET | Freq: Every day | ORAL | 0 refills | Status: DC
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
## 2020-10-19 ENCOUNTER — Encounter: Payer: Self-pay | Admitting: Internal Medicine

## 2020-10-21 ENCOUNTER — Encounter: Payer: Self-pay | Admitting: Neurology

## 2020-10-21 ENCOUNTER — Ambulatory Visit (INDEPENDENT_AMBULATORY_CARE_PROVIDER_SITE_OTHER): Payer: 59 | Admitting: Neurology

## 2020-10-21 VITALS — BP 114/79 | HR 68 | Ht 62.0 in | Wt 173.3 lb

## 2020-10-21 DIAGNOSIS — R0683 Snoring: Secondary | ICD-10-CM

## 2020-10-21 DIAGNOSIS — R635 Abnormal weight gain: Secondary | ICD-10-CM

## 2020-10-21 DIAGNOSIS — G4719 Other hypersomnia: Secondary | ICD-10-CM

## 2020-10-21 DIAGNOSIS — E669 Obesity, unspecified: Secondary | ICD-10-CM | POA: Diagnosis not present

## 2020-10-21 DIAGNOSIS — R351 Nocturia: Secondary | ICD-10-CM

## 2020-10-21 DIAGNOSIS — R519 Headache, unspecified: Secondary | ICD-10-CM

## 2020-10-21 NOTE — Patient Instructions (Signed)

## 2020-10-21 NOTE — Progress Notes (Signed)
Subjective:    Patient ID: Deanna Mcdonald is a 50 y.o. female.  HPI     Star Age, MD, PhD [facility] [address] [place], [place] [address]  Dear Dr. Isaac Bliss,  I saw your patient, Deanna Mcdonald, upon your kind request in the sleep clinic today for consultation of her sleep disorder, in particular, concern for underlying obstructive sleep apnea and a possible prior diagnosis of narcolepsy.  Patient is unaccompanied today.  As you know, Deanna Mcdonald is a 50 year old right-handed woman with an underlying medical history of ADHD, anxiety, depression, and borderline obesity, who reports worsening snoring and excessive daytime somnolence.  She was previously diagnosed with narcolepsy in 2008.  As she recalls, she only had an overnight sleep study, not a daytime sleep study however.  She reports seeing Dr. Tonia Ghent at the time.  Prior sleep study results are not available for my review today.  She is willing to sign a release of information form.  She reports that she tried different medications for narcolepsy including Xyrem which caused cognitive side effects and no visual, which also caused side effects.  She has been on Vyvanse but stopped it last year.  She has been on other medications including gabapentin, trazodone, and Effexor but stopped her medications within the last year.  She is seeing psychiatry and is supposed to start Lamictal.  She reports weight gain in the realm of 10 or 15 pounds in the past year.  She reports that her snoring is disturbing her and wakes her up.  She has had palpitations at times.  She has woken up with a headache at times.  She takes over-the-counter Aleve or prescription naproxen depending on whether she also has significant knee pain at the time.  She is a non-smoker.  She does not drink any alcohol regularly, only occasionally, she does not drink caffeine daily.  She is not aware of any family history of sleep  apnea.  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  She goes to bed around 10 and it takes her about 30 minutes to fall asleep, rise time is around 6.  She is divorced, she has a total of 5 children, 1 daughter lives with her at this time.  She reports that she has a whole binder of prior records including medications that she has tried but she did not bring any records to this appointment.  Addendum: Of note, I was able to review an office visit note from 05/17/2016.  Patient saw Dr. Tonia Ghent at the time.  According to the office note, she had a PSG on 12/10/2010 which showed an AHI of 0.5/h with no desaturations, no snoring noted.  She had an MSLT with a mean sleep latency of 1.9 minutes for 4 naps with REM and at least every nap reported.  Medication list at the time included trazodone, Adderall XR 10 mg twice daily, Ativan 0.5 mg daily, written on 10 mg twice daily, Nuvigil 250 mg daily, Ambien 10 mg as needed.  She also had a consultation with Thomos Lemons, PA at [facility] on 11/25/2018 for insomnia.  She was referred to behavioral health at the time.  Her Past Medical History Is Significant For: Past Medical History:  Diagnosis Date  . ADHD   . Anxiety   . Depression   . Narcolepsy   . Narcolepsy     Her Past Surgical History Is Significant For: Past Surgical  History:  Procedure Laterality Date  . BREAST BIOPSY    . CESAREAN SECTION    . COCHLEAR IMPLANT      Her Family History Is Significant For: Family History  Problem Relation Age of Onset  . Breast cancer Maternal Grandmother 52  . Diabetes Father   . Thyroid disease Brother   . Stroke Paternal Grandmother   . Thyroid disease Maternal Aunt     Her Social History Is Significant For: Social History   Socioeconomic History  . Marital status: Divorced    Spouse name: Not on file  . Number of children: Not on file  . Years of education: Not on file  . Highest education level: Not on file  Occupational History   . Occupation: Pharmacist, hospital  Tobacco Use  . Smoking status: Never Smoker  . Smokeless tobacco: Never Used  Vaping Use  . Vaping Use: Never used  Substance and Sexual Activity  . Alcohol use: Yes  . Drug use: Never  . Sexual activity: Not Currently    Partners: Male    Birth control/protection: I.U.D.  Other Topics Concern  . Not on file  Social History Narrative  . Not on file   Social Determinants of Health   Financial Resource Strain: Not on file  Food Insecurity: Not on file  Transportation Needs: Not on file  Physical Activity: Not on file  Stress: Not on file  Social Connections: Not on file    Her Allergies Are:  Allergies  Allergen Reactions  . Zolpidem     Thoughts of suicide Thoughts of suicide   . Bupropion Nausea Only    Ringing in ears, chest tightness  . Ibuprofen Hives  . Latex Hives  . Eggs Or Egg-Derived Products Nausea And Vomiting  . Prednisone Anxiety  :   Her Current Medications Are:  Outpatient Encounter Medications as of 10/21/2020  Medication Sig  . fexofenadine (ALLEGRA) 180 MG tablet Take by mouth.  . lamoTRIgine (LAMICTAL) 25 MG tablet Take 1 tablet (25 mg total) by mouth daily. Take one tablet daily for a week and then start taking 2 tablets.  Marland Kitchen levonorgestrel (MIRENA) 20 MCG/24HR IUD 1 each by Intrauterine route once.  . Magnesium 250 MG TABS Take by mouth.  . melatonin 3 MG TABS tablet Take 3 mg by mouth at bedtime.  . naproxen (NAPROSYN) 500 MG tablet Take 500 mg by mouth 2 (two) times daily with a meal.  . OVER THE COUNTER MEDICATION Smarty pants PHD - womens 100mg   . pseudoephedrine (SUDAFED) 30 MG tablet Take 30 mg by mouth every 4 (four) hours as needed for congestion.  . valACYclovir (VALTREX) 1000 MG tablet Take by mouth.  . Vitamin D, Ergocalciferol, (DRISDOL) 1.25 MG (50000 UNIT) CAPS capsule TAKE 1 CAPSULE BY MOUTH EVERY 7 DAYS FOR 12 DOSES  . [DISCONTINUED] 5-Hydroxytryptophan (5-HTP) 50 MG TABS Take by mouth.  . [DISCONTINUED]  Cholecalciferol (D3-1000 PO) Take by mouth.  . [DISCONTINUED] gabapentin (NEURONTIN) 300 MG capsule   . [DISCONTINUED] lisdexamfetamine (VYVANSE) 10 MG capsule Take 10 mg by mouth daily.  . [DISCONTINUED] OVER THE COUNTER MEDICATION L - Theanine 200 mg (Patient not taking: Reported on 06/04/2020)  . [DISCONTINUED] traZODone (DESYREL) 100 MG tablet TK 1/2 TO 1 T PO A COUPLE H B BED  . [DISCONTINUED] venlafaxine (EFFEXOR) 37.5 MG tablet TAKE 1 TABLET(37.5 MG) BY MOUTH TWICE DAILY   No facility-administered encounter medications on file as of 10/21/2020.  :   Review of Systems:  Out of a complete 14 point review of systems, all are reviewed and negative with the exception of these symptoms as listed below:  Review of Systems  Neurological:       Here for sleep consult. Prior sleep study est. In 2008. 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 She reports trying, Xyrem and Nuvigil in the past along with two others but could not recall the names. Reports she stopped meds for this dx 5 years ago. Reports the meds made her feel more tired. Here today for reassessment. Adheres to snoring at night.    Epworth Sleepiness Scale 0= would never doze 1= slight chance of dozing 2= moderate chance of dozing 3= high chance of dozing  Sitting and reading:1 Watching TV:1 Sitting inactive in a public place (ex. Theater or meeting):1 As a passenger in a car for an hour without a break:3 Lying down to rest in the afternoon:2 Sitting and talking to someone:0 Sitting quietly after lunch (no alcohol):1 In a car, while stopped in traffic:0 Total:9     Objective:  Neurological Exam  Physical Exam Physical Examination:   Vitals:   10/21/20 1514  BP: 114/79  Pulse: 68    General Examination: The patient is a very pleasant 50 y.o. female in no acute distress. She appears well-developed and well-nourished and well groomed.   HEENT: Normocephalic, atraumatic, pupils are equal, round and  reactive to light, extraocular tracking is good without limitation to gaze excursion or nystagmus noted. Hearing is grossly intact. Face is symmetric with normal facial animation. Speech is clear with no dysarthria noted. There is no hypophonia. There is no lip, neck/head, jaw or voice tremor. Neck is supple with full range of passive and active motion. There are no carotid bruits on auscultation. Oropharynx exam reveals: mild mouth dryness, adequate dental hygiene and mild airway crowding, due to small airway entry, tonsillar size 1+, Mallampati class III.  Neck circumference of 13 5/8 inches.  She has a minimal overbite.  Tongue protrudes centrally and palate elevates symmetrically.  Chest: Clear to auscultation without wheezing, rhonchi or crackles noted.  Heart: S1+S2+0, regular and normal without murmurs, rubs or gallops noted.   Abdomen: Soft, non-tender and non-distended with normal bowel sounds appreciated on auscultation.  Extremities: There is no pitting edema in the distal lower extremities bilaterally.   Skin: Warm and dry without trophic changes noted.   Musculoskeletal: exam reveals no obvious joint deformities, tenderness or joint swelling or erythema.   Neurologically:  Mental status: The patient is awake, alert and oriented in all 4 spheres. Her immediate and remote memory, attention, language skills and fund of knowledge are appropriate. There is no evidence of aphasia, agnosia, apraxia or anomia. Speech is clear with normal prosody and enunciation. Thought process is linear. Mood is normal and affect is normal.  Cranial nerves II - XII are as described above under HEENT exam.  Motor exam: Normal bulk, strength and tone is noted. There is no tremor, Romberg is negative. Fine motor skills and coordination: grossly intact.  Cerebellar testing: No dysmetria or intention tremor. There is no truncal or gait ataxia.  Sensory exam: intact to light touch in the upper and lower  extremities.  Gait, station and balance: She stands easily. No veering to one side is noted. No leaning to one side is noted. Posture is age-appropriate and stance is narrow based. Gait shows normal stride length and normal pace. No problems turning are noted. Tandem walk is unremarkable.  Assessment and Plan:  In summary, SHABREA WELDIN is a very pleasant 50 y.o.-year old female with an underlying medical history of ADHD, anxiety, depression, and borderline obesity, who presents for evaluation of her sleep disturbance with her main concern being snoring and sleep disruption as well as daytime somnolence, concern for sleep apnea.  She carries a prior diagnosis of narcolepsy and reports side effects with medications tried including stimulant and nonstimulant wake promoting agents as well as Xyrem.  I do not have the official sleep study reports, it looks like she did have a PSG in 2012 although she reports sleep testing in 2008.  She did have a daytime sleep study, the exact date is not visible to me, not clear if she had these sleep tests back to back.  Not sure if she had tapered off all psychotropic medications at the time.  We will try to get additional records and she signed a release of information form.  At this juncture, we will proceed with a nocturnal polysomnogram to evaluate her for sleep disordered breathing.  If she has obstructive sleep apnea, we can consider treatment with a CPAP or AutoPap machine.  I had a long chat with the patient about my findings and the diagnosis of OSA, its prognosis and treatment options. We talked about medical treatments, surgical interventions and non-pharmacological approaches. I explained in particular the risks and ramifications of untreated moderate to severe OSA, especially with respect to developing cardiovascular disease down the Road, including congestive heart failure, difficult to treat hypertension, cardiac arrhythmias, or stroke. Even type 2  diabetes has, in part, been linked to untreated OSA. Symptoms of untreated OSA include daytime sleepiness, memory problems, mood irritability and mood disorder such as depression and anxiety, lack of energy, as well as recurrent headaches, especially morning headaches. We talked about trying to maintain a healthy lifestyle in general, as well as the importance of weight control. We also talked about the importance of good sleep hygiene. I recommended the following at this time: sleep study.   I explained the sleep test procedure to the patient and also outlined possible surgical and non-surgical treatment options of OSA, including the use of a custom-made dental device (which would require a referral to a specialist dentist or oral surgeon), upper airway surgical options, such as traditional UPPP or a novel less invasive surgical option in the form of Inspire hypoglossal nerve stimulation (which would involve a referral to an ENT surgeon). I also explained the CPAP treatment option to the patient, who indicated that she would be willing to try CPAP if the need arises. I explained the importance of being compliant with PAP treatment, not only for insurance purposes but primarily to improve Her symptoms, and for the patient's long term health benefit, including to reduce Her cardiovascular risks. I answered all her questions today and the patient was in agreement. I plan to see her back after the sleep study is completed and encouraged her to call with any interim questions, concerns, problems or updates.   Thank you very much for allowing me to participate in the care of this nice patient. If I can be of any further assistance to you please do not hesitate to call me at [phone].  Sincerely,   Star Age, MD, PhD

## 2020-10-25 ENCOUNTER — Telehealth: Payer: Self-pay | Admitting: *Deleted

## 2020-10-25 NOTE — Telephone Encounter (Signed)
Request faxed to Lauraine Rinne [phone]

## 2020-11-05 ENCOUNTER — Telehealth (HOSPITAL_COMMUNITY): Payer: Self-pay

## 2020-11-05 NOTE — Telephone Encounter (Signed)
I Called, she did not pick up. Let he know to stop lamictal for now, it was sent a month ago.

## 2020-11-05 NOTE — Telephone Encounter (Signed)
Patient would like to speak with Dr. De Nurse. She states she started Lamictal last Friday and she feels numb, and disconnected from herself. She also says that it takes a lot of effort to do anything.   CB# [phone]

## 2020-11-16 ENCOUNTER — Encounter: Payer: Self-pay | Admitting: Internal Medicine

## 2020-11-16 ENCOUNTER — Telehealth: Payer: Self-pay

## 2020-11-16 NOTE — Telephone Encounter (Signed)
Pt called stating she thinks she is losing her insurance soon. I informed the pt to call us back once she finds out. If so she will also need to call us back with her new insurance info once she has all of that. Pt verbalized understanding.

## 2020-11-25 ENCOUNTER — Telehealth (HOSPITAL_COMMUNITY): Payer: 59 | Admitting: Psychiatry

## 2020-12-06 ENCOUNTER — Telehealth (HOSPITAL_COMMUNITY): Payer: 59 | Admitting: Psychiatry

## 2021-05-23 ENCOUNTER — Telehealth: Payer: Self-pay | Admitting: Neurology

## 2021-05-23 NOTE — Telephone Encounter (Signed)
Returned pt's call and LVM needing to get more information on her new insurance

## 2021-06-07 ENCOUNTER — Telehealth: Payer: Self-pay | Admitting: Neurology

## 2021-06-07 NOTE — Telephone Encounter (Signed)
Returned pt's call and LVM to give update on scheduling her sleep study

## 2021-07-12 ENCOUNTER — Telehealth: Payer: Self-pay | Admitting: Neurology

## 2021-07-12 NOTE — Telephone Encounter (Signed)
LVM for pt to call me back to schedule sleep study  

## 2021-07-14 ENCOUNTER — Telehealth: Payer: Self-pay

## 2021-07-14 NOTE — Telephone Encounter (Signed)
Returned pt's call and LVM for pt to call me back to schedule sleep study ° °

## 2021-07-21 ENCOUNTER — Telehealth: Payer: Self-pay

## 2021-07-21 NOTE — Telephone Encounter (Signed)
LVM for pt to call me back to schedule sleep study  

## 2021-08-01 ENCOUNTER — Ambulatory Visit (INDEPENDENT_AMBULATORY_CARE_PROVIDER_SITE_OTHER): Payer: Self-pay | Admitting: Neurology

## 2021-08-01 DIAGNOSIS — R519 Headache, unspecified: Secondary | ICD-10-CM

## 2021-08-01 DIAGNOSIS — R0683 Snoring: Secondary | ICD-10-CM

## 2021-08-01 DIAGNOSIS — R351 Nocturia: Secondary | ICD-10-CM

## 2021-08-01 DIAGNOSIS — E669 Obesity, unspecified: Secondary | ICD-10-CM

## 2021-08-01 DIAGNOSIS — G4719 Other hypersomnia: Secondary | ICD-10-CM

## 2021-08-01 DIAGNOSIS — G4733 Obstructive sleep apnea (adult) (pediatric): Secondary | ICD-10-CM

## 2021-08-01 DIAGNOSIS — R635 Abnormal weight gain: Secondary | ICD-10-CM

## 2021-08-03 NOTE — Progress Notes (Signed)
° °  [facility]  HOME SLEEP TEST (Watch PAT) REPORT  STUDY DATE: 08/01/2021  DOB: 09-27-1970  MRN: [id number]  ORDERING CLINICIAN: Star Age, MD, PhD   REFERRING CLINICIAN: Isaac Bliss, Rayford Halsted, MD   CLINICAL INFORMATION/HISTORY: 51 year old right-handed woman with an underlying medical history of ADHD, anxiety, depression, and borderline obesity, who reports worsening snoring and excessive daytime somnolence.  Epworth sleepiness score: 9/24.  BMI: 32 kg/m  FINDINGS:   Sleep Summary:   Total Recording Time (hours, min): 8 hours, 47 minutes  Total Sleep Time (hours, min):  7 hours, 37 minutes   Percent REM (%):    19.6%   Respiratory Indices:   Calculated pAHI (per hour):  12.6/hour         REM pAHI:    14.9/hour       NREM pAHI: 12.1/hour  Oxygen Saturation Statistics:    Oxygen Saturation (%) Mean: 95%   Minimum oxygen saturation (%):                 87%   O2 Saturation Range (%): 87-99%    O2 Saturation (minutes) <=88%: 0.1 min  Pulse Rate Statistics:   Pulse Mean (bpm):    67/min    Pulse Range (55-101/min)   IMPRESSION: OSA (obstructive sleep apnea)   RECOMMENDATION:  This home sleep test demonstrates overall mild obstructive sleep apnea with a total AHI of 12.6/hour and O2 nadir of 87%.  Intermittent mild to moderate snoring was detected.  Given the patient's medical history and sleep related complaints, treatment with positive airway pressure is recommended. This can be achieved in the form of autoPAP trial/titration at home. A  full night CPAP titration study will help with proper treatment settings and mask fitting if needed, down the road. Alternative treatments may include weight loss along with avoidance of the supine sleep position, or an oral appliance in appropriate candidates.   Please note that untreated obstructive sleep apnea may carry additional perioperative morbidity. Patients with significant obstructive sleep  apnea should receive perioperative PAP therapy and the surgeons and particularly the anesthesiologist should be informed of the diagnosis and the severity of the sleep disordered breathing. The patient should be cautioned not to drive, work at heights, or operate dangerous or heavy equipment when tired or sleepy. Review and reiteration of good sleep hygiene measures should be pursued with any patient. Other causes of the patient's symptoms, including circadian rhythm disturbances, an underlying mood disorder, medication effect and/or an underlying medical problem cannot be ruled out based on this test. Clinical correlation is recommended.   The patient and her referring provider will be notified of the test results. The patient will be seen in follow up in sleep clinic at [facility].  I certify that I have reviewed the raw data recording prior to the issuance of this report in accordance with the standards of the American Academy of Sleep Medicine (AASM).   INTERPRETING PHYSICIAN:   Star Age, MD, PhD  Board Certified in Neurology and Sleep Medicine  [facility] [address] [place], [place] [address] [phone]

## 2021-08-05 NOTE — Procedures (Signed)
° °  [facility]  HOME SLEEP TEST (Watch PAT) REPORT  STUDY DATE: 08/01/2021  DOB: July 04, 1970  MRN: [id number]  ORDERING CLINICIAN: Star Age, MD, PhD   REFERRING CLINICIAN: Isaac Bliss, Rayford Halsted, MD   CLINICAL INFORMATION/HISTORY: 51 year old right-handed woman with an underlying medical history of ADHD, anxiety, depression, and borderline obesity, who reports worsening snoring and excessive daytime somnolence.  Epworth sleepiness score: 9/24.  BMI: 32 kg/m  FINDINGS:   Sleep Summary:   Total Recording Time (hours, min): 8 hours, 47 minutes  Total Sleep Time (hours, min):  7 hours, 37 minutes   Percent REM (%):    19.6%   Respiratory Indices:   Calculated pAHI (per hour):  12.6/hour         REM pAHI:    14.9/hour       NREM pAHI: 12.1/hour  Oxygen Saturation Statistics:    Oxygen Saturation (%) Mean: 95%   Minimum oxygen saturation (%):                 87%   O2 Saturation Range (%): 87-99%    O2 Saturation (minutes) <=88%: 0.1 min  Pulse Rate Statistics:   Pulse Mean (bpm):    67/min    Pulse Range (55-101/min)   IMPRESSION: OSA (obstructive sleep apnea)   RECOMMENDATION:  This home sleep test demonstrates overall mild obstructive sleep apnea with a total AHI of 12.6/hour and O2 nadir of 87%.  Intermittent mild to moderate snoring was detected.  Given the patient's medical history and sleep related complaints, treatment with positive airway pressure is recommended. This can be achieved in the form of autoPAP trial/titration at home. A  full night CPAP titration study will help with proper treatment settings and mask fitting if needed, down the road. Alternative treatments may include weight loss along with avoidance of the supine sleep position, or an oral appliance in appropriate candidates.   Please note that untreated obstructive sleep apnea may carry additional perioperative morbidity. Patients with significant obstructive sleep  apnea should receive perioperative PAP therapy and the surgeons and particularly the anesthesiologist should be informed of the diagnosis and the severity of the sleep disordered breathing. The patient should be cautioned not to drive, work at heights, or operate dangerous or heavy equipment when tired or sleepy. Review and reiteration of good sleep hygiene measures should be pursued with any patient. Other causes of the patient's symptoms, including circadian rhythm disturbances, an underlying mood disorder, medication effect and/or an underlying medical problem cannot be ruled out based on this test. Clinical correlation is recommended.   The patient and her referring provider will be notified of the test results. The patient will be seen in follow up in sleep clinic at [facility].  I certify that I have reviewed the raw data recording prior to the issuance of this report in accordance with the standards of the American Academy of Sleep Medicine (AASM).   INTERPRETING PHYSICIAN:   Star Age, MD, PhD  Board Certified in Neurology and Sleep Medicine  [facility] [address], [place], [place] [address] [phone]

## 2021-08-05 NOTE — Addendum Note (Signed)
Addended by: Star Age on: 08/05/2021 12:31 PM   Modules accepted: Orders

## 2021-08-11 ENCOUNTER — Telehealth: Payer: Self-pay | Admitting: *Deleted

## 2021-08-11 DIAGNOSIS — G4733 Obstructive sleep apnea (adult) (pediatric): Secondary | ICD-10-CM

## 2021-08-11 NOTE — Addendum Note (Signed)
Addended by: Gildardo Griffes on: 08/11/2021 12:26 PM   Modules accepted: Orders

## 2021-08-11 NOTE — Telephone Encounter (Signed)
Cpap order, insurance/demographics, office note, and sleep study result faxed to [facility]. Received a receipt of confirmation.

## 2021-08-11 NOTE — Telephone Encounter (Addendum)
I called the pt and LVM (ok per DPR) with details of sleep study result and recommendation as noted below by Dr Rexene Alberts. I explained the insurance compliance requirements as well as the process to get started which includes sending order to DME company. Advised pt we do need to talk with her before selecting a company. Left office number asking for call back or mychart message response.  Result sent to PCP.    ----- Message from Star Age, MD sent at 08/05/2021 12:31 PM EST ----- Patient referred by PCP, seen by me on 10/21/2020, HST 08/01/2021.    Please call and notify the patient that the recent home sleep test showed obstructive sleep apnea. OSA is overall mild, but worth treating to see if she feels better after treatment. To that end I recommend treatment for this in the form of autoPAP, which means, that we don't have to bring her in for a sleep study with CPAP, but will let her try an autoPAP machine at home, through a DME company (of her choice, or as per insurance requirement). The DME representative will educate her on how to use the machine, how to put the mask on, etc. I have placed an order in the chart. Please send referral, talk to patient, send report to referring MD. We will need a FU in sleep clinic for 10 weeks post-PAP set up, please arrange that with me or one of our NPs. Thanks,   Star Age, MD, PhD [facility])

## 2021-09-08 ENCOUNTER — Ambulatory Visit (INDEPENDENT_AMBULATORY_CARE_PROVIDER_SITE_OTHER): Payer: BLUE CROSS/BLUE SHIELD | Admitting: Internal Medicine

## 2021-09-08 ENCOUNTER — Encounter: Payer: Self-pay | Admitting: Internal Medicine

## 2021-09-08 VITALS — BP 110/78 | HR 80 | Temp 97.9°F | Ht 62.75 in | Wt 171.9 lb

## 2021-09-08 DIAGNOSIS — Z1231 Encounter for screening mammogram for malignant neoplasm of breast: Secondary | ICD-10-CM | POA: Diagnosis not present

## 2021-09-08 DIAGNOSIS — Z1211 Encounter for screening for malignant neoplasm of colon: Secondary | ICD-10-CM | POA: Diagnosis not present

## 2021-09-08 DIAGNOSIS — E559 Vitamin D deficiency, unspecified: Secondary | ICD-10-CM

## 2021-09-08 DIAGNOSIS — Z0000 Encounter for general adult medical examination without abnormal findings: Secondary | ICD-10-CM | POA: Diagnosis not present

## 2021-09-08 DIAGNOSIS — F339 Major depressive disorder, recurrent, unspecified: Secondary | ICD-10-CM

## 2021-09-08 LAB — CBC WITH DIFFERENTIAL/PLATELET
Basophils Absolute: 0 10*3/uL (ref 0.0–0.1)
Basophils Relative: 0.6 % (ref 0.0–3.0)
Eosinophils Absolute: 0.1 10*3/uL (ref 0.0–0.7)
Eosinophils Relative: 1.2 % (ref 0.0–5.0)
HCT: 41.4 % (ref 36.0–46.0)
Hemoglobin: 13.6 g/dL (ref 12.0–15.0)
Lymphocytes Relative: 36.3 % (ref 12.0–46.0)
Lymphs Abs: 1.8 10*3/uL (ref 0.7–4.0)
MCHC: 33 g/dL (ref 30.0–36.0)
MCV: 90.3 fl (ref 78.0–100.0)
Monocytes Absolute: 0.4 10*3/uL (ref 0.1–1.0)
Monocytes Relative: 7.9 % (ref 3.0–12.0)
Neutro Abs: 2.6 10*3/uL (ref 1.4–7.7)
Neutrophils Relative %: 54 % (ref 43.0–77.0)
Platelets: 197 10*3/uL (ref 150.0–400.0)
RBC: 4.58 Mil/uL (ref 3.87–5.11)
RDW: 13.8 % (ref 11.5–15.5)
WBC: 4.9 10*3/uL (ref 4.0–10.5)

## 2021-09-08 LAB — COMPREHENSIVE METABOLIC PANEL
ALT: 17 U/L (ref 0–35)
AST: 23 U/L (ref 0–37)
Albumin: 4.5 g/dL (ref 3.5–5.2)
Alkaline Phosphatase: 59 U/L (ref 39–117)
BUN: 16 mg/dL (ref 6–23)
CO2: 31 mEq/L (ref 19–32)
Calcium: 9.6 mg/dL (ref 8.4–10.5)
Chloride: 104 mEq/L (ref 96–112)
Creatinine, Ser: 0.85 mg/dL (ref 0.40–1.20)
GFR: 79.76 mL/min (ref >60.00)
Glucose, Bld: 88 mg/dL (ref 70–99)
Potassium: 4.5 mEq/L (ref 3.5–5.1)
Sodium: 140 mEq/L (ref 135–145)
Total Bilirubin: 0.4 mg/dL (ref 0.2–1.2)
Total Protein: 7.2 g/dL (ref 6.0–8.3)

## 2021-09-08 LAB — LIPID PANEL
Cholesterol: 148 mg/dL (ref 0–200)
HDL: 64.7 mg/dL (ref >39.00)
LDL Cholesterol: 75 mg/dL (ref 0–99)
NonHDL: 83.08
Total CHOL/HDL Ratio: 2
Triglycerides: 39 mg/dL (ref 0.0–149.0)
VLDL: 7.8 mg/dL (ref 0.0–40.0)

## 2021-09-08 LAB — VITAMIN D 25 HYDROXY (VIT D DEFICIENCY, FRACTURES): VITD: 30.73 ng/mL (ref 30.00–100.00)

## 2021-09-08 LAB — HEMOGLOBIN A1C: Hgb A1c MFr Bld: 5.6 % (ref 4.6–6.5)

## 2021-09-08 NOTE — Patient Instructions (Signed)
-  Nice seeing you today!! ? ?-Lab work today; will notify you once results are available. ? ?-Remember your COVID booster, shingles and tdap vaccines. ? ?-Schedule follow up in 1 year or sooner as needed. ? ? ?

## 2021-09-08 NOTE — Progress Notes (Signed)
? ? ? ?Established Patient Office Visit ? ? ? ? ?This visit occurred during the SARS-CoV-2 public health emergency.  Safety protocols were in place, including screening questions prior to the visit, additional usage of staff PPE, and extensive cleaning of exam room while observing appropriate contact time as indicated for disinfecting solutions.  ? ? ?CC/Reason for Visit: Annual preventive exam ? ?HPI: Deanna Mcdonald is a 51 y.o. female who is coming in today for the above mentioned reasons. Past Medical History is significant for: Recently diagnosed sleep apnea awaiting CPAP, depression, anxiety followed by psychiatry.  She has routine eye care, is overdue for dental care.  She is due for COVID booster, Tdap, shingles vaccine.  She is overdue for mammogram, colonoscopy.  She had a Pap smear in 2021 with her gynecologist.  She has no acute concerns or complaints today. ? ? ?Past Medical/Surgical History: ?Past Medical History:  ?Diagnosis Date  ? ADHD   ? Anxiety   ? Depression   ? Narcolepsy   ? Narcolepsy   ? ? ?Past Surgical History:  ?Procedure Laterality Date  ? BREAST BIOPSY    ? CESAREAN SECTION    ? COCHLEAR IMPLANT    ? ? ?Social History: ? reports that she has never smoked. She has never used smokeless tobacco. She reports current alcohol use. She reports that she does not use drugs. ? ?Allergies: ?Allergies  ?Allergen Reactions  ? Zolpidem   ?  Thoughts of suicide ?Thoughts of suicide ?  ? Bupropion Nausea Only  ?  Ringing in ears, chest tightness  ? Ibuprofen Hives  ? Latex Hives  ? Eggs Or Egg-Derived Products Nausea And Vomiting  ? Prednisone Anxiety  ? ? ?Family History:  ?Family History  ?Problem Relation Age of Onset  ? Breast cancer Maternal Grandmother 61  ? Diabetes Father   ? Thyroid disease Brother   ? Stroke Paternal Grandmother   ? Thyroid disease Maternal Aunt   ? ? ? ?Current Outpatient Medications:  ?  fexofenadine (ALLEGRA) 180 MG tablet, Take by mouth., Disp: , Rfl:  ?  levonorgestrel  (MIRENA) 20 MCG/24HR IUD, 1 each by Intrauterine route once., Disp: , Rfl:  ?  Magnesium 250 MG TABS, Take by mouth., Disp: , Rfl:  ?  melatonin 3 MG TABS tablet, Take 3 mg by mouth at bedtime., Disp: , Rfl:  ?  naproxen (NAPROSYN) 500 MG tablet, Take 500 mg by mouth 2 (two) times daily with a meal., Disp: , Rfl:  ?  OVER THE COUNTER MEDICATION, Smarty pants PHD - womens '100mg'$ , Disp: , Rfl:  ?  pseudoephedrine (SUDAFED) 30 MG tablet, Take 30 mg by mouth every 4 (four) hours as needed for congestion., Disp: , Rfl:  ?  valACYclovir (VALTREX) 1000 MG tablet, Take by mouth., Disp: , Rfl:  ?  Vitamin D, Ergocalciferol, (DRISDOL) 1.25 MG (50000 UNIT) CAPS capsule, TAKE 1 CAPSULE BY MOUTH EVERY 7 DAYS FOR 12 DOSES, Disp: 12 capsule, Rfl: 0 ?  fluocinonide ointment (LIDEX) 0.05 %, Apply topically 2 (two) times daily., Disp: , Rfl:  ? ?Review of Systems:  ?Constitutional: Denies fever, chills, diaphoresis, appetite change and fatigue.  ?HEENT: Denies photophobia, eye pain, redness, hearing loss, ear pain, congestion, sore throat, rhinorrhea, sneezing, mouth sores, trouble swallowing, neck pain, neck stiffness and tinnitus.   ?Respiratory: Denies SOB, DOE, cough, chest tightness,  and wheezing.   ?Cardiovascular: Denies chest pain, palpitations and leg swelling.  ?Gastrointestinal: Denies nausea, vomiting, abdominal pain, diarrhea,  constipation, blood in stool and abdominal distention.  ?Genitourinary: Denies dysuria, urgency, frequency, hematuria, flank pain and difficulty urinating.  ?Endocrine: Denies: hot or cold intolerance, sweats, changes in hair or nails, polyuria, polydipsia. ?Musculoskeletal: Denies myalgias, back pain, joint swelling, arthralgias and gait problem.  ?Skin: Denies pallor, rash and wound.  ?Neurological: Denies dizziness, seizures, syncope, weakness, light-headedness, numbness and headaches.  ?Hematological: Denies adenopathy. Easy bruising, personal or family bleeding history   ?Psychiatric/Behavioral: Denies suicidal ideation, mood changes, confusion, nervousness, sleep disturbance and agitation ? ? ? ?Physical Exam: ?Vitals:  ? 09/08/21 0909  ?BP: 110/78  ?Pulse: 80  ?Temp: 97.9 ?F (36.6 ?C)  ?TempSrc: Oral  ?SpO2: 98%  ?Weight: 171 lb 14.4 oz (78 kg)  ?Height: 5' 2.75" (1.594 m)  ? ? ?Body mass index is 30.69 kg/m?. ? ? ?Constitutional: NAD, calm, comfortable ?Eyes: PERRL, lids and conjunctivae normal ?ENMT: Mucous membranes are moist. Posterior pharynx clear of any exudate or lesions. Normal dentition. Tympanic membrane is pearly white, no erythema or bulging. ?Neck: normal, supple, no masses, no thyromegaly ?Respiratory: clear to auscultation bilaterally, no wheezing, no crackles. Normal respiratory effort. No accessory muscle use.  ?Cardiovascular: Regular rate and rhythm, no murmurs / rubs / gallops. No extremity edema. 2+ pedal pulses. No carotid bruits.  ?Abdomen: no tenderness, no masses palpated. No hepatosplenomegaly. Bowel sounds positive.  ?Musculoskeletal: no clubbing / cyanosis. No joint deformity upper and lower extremities. Good ROM, no contractures. Normal muscle tone.  ?Skin: no rashes, lesions, ulcers. No induration ?Neurologic: CN 2-12 grossly intact. Sensation intact, DTR normal. Strength 5/5 in all 4.  ?Psychiatric: Normal judgment and insight. Alert and oriented x 3. Normal mood.  ? ? ?Impression and Plan: ? ?Encounter for preventive health examination  ?- Plan: CBC with Differential/Platelet, Comprehensive metabolic panel, Hemoglobin A1c, Lipid panel ?-Recommend routine eye and dental care. ?-Immunizations: She declines all immunizations today despite counseling. ?-Healthy lifestyle discussed in detail. ?-Labs to be updated today. ?-Colon cancer screening: GI referral placed, overdue ?-Breast cancer screening: Overdue, mammogram referral placed ?-Cervical cancer screening: 2021 with GYN ?-Lung cancer screening: Not applicable ?-Prostate cancer screening: Not  applicable ?-DEXA: Not applicable ? ?Screening for malignant neoplasm of colon  ?- Plan: Ambulatory referral to Gastroenterology ? ?Encounter for screening mammogram for malignant neoplasm of breast  ?- Plan: MM Digital Screening ? ?Vitamin D deficiency ? - Plan: VITAMIN D 25 Hydroxy (Vit-D Deficiency, Fractures) ? ?Depression, recurrent ([place]) ?-Followed by psychiatry ? ? ? ?Patient Instructions  ?-Nice seeing you today!! ? ?-Lab work today; will notify you once results are available. ? ?-Remember your COVID booster, shingles and tdap vaccines. ? ?-Schedule follow up in 1 year or sooner as needed. ? ? ? ? ? ?Lelon Frohlich, MD ?[facility] ? ? ?

## 2021-10-07 ENCOUNTER — Encounter: Payer: Self-pay | Admitting: Internal Medicine

## 2021-10-07 NOTE — Telephone Encounter (Signed)
Documents printed & placed in MD folder for review. Pt advised to schedule appt to discuss details of accommodation since this was not discussed in last OV on 09/08/21. ?

## 2021-10-11 ENCOUNTER — Telehealth (INDEPENDENT_AMBULATORY_CARE_PROVIDER_SITE_OTHER): Payer: BLUE CROSS/BLUE SHIELD | Admitting: Internal Medicine

## 2021-10-11 ENCOUNTER — Ambulatory Visit: Payer: BLUE CROSS/BLUE SHIELD | Admitting: Internal Medicine

## 2021-10-11 ENCOUNTER — Telehealth: Payer: Self-pay | Admitting: Internal Medicine

## 2021-10-11 ENCOUNTER — Telehealth: Payer: Self-pay | Admitting: *Deleted

## 2021-10-11 VITALS — Wt 170.0 lb

## 2021-10-11 DIAGNOSIS — F339 Major depressive disorder, recurrent, unspecified: Secondary | ICD-10-CM | POA: Diagnosis not present

## 2021-10-11 NOTE — Telephone Encounter (Signed)
Left message on machine for patient. ?Patient will need to schedule an appointment for the restriction form to be filled out. ?

## 2021-10-11 NOTE — Progress Notes (Signed)
? ? ?Virtual Visit via Video Note ? ?I connected with [name] on 10/11/21 at  4:00 PM EDT by a video enabled telemedicine application and verified that I am speaking with the correct person using two identifiers. ? ?Location patient: home ?Location provider: work office ?Persons participating in the virtual visit: patient, provider ? ?I discussed the limitations of evaluation and management by telemedicine and the availability of in person appointments. The patient expressed understanding and agreed to proceed. ? ? ?HPI: ?This visit has been scheduled to fill out a work accommodation form.  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  She is requesting reduction in hours due to her severe depression.  She is having difficulty concentrating and gets startled easily.  She has been undergoing CBT and vocational rehab and that has been their recommendation. ? ?ROS: ?Constitutional: Denies fever, chills, diaphoresis, appetite change and fatigue.  ?HEENT: Denies photophobia, eye pain, redness, hearing loss, ear pain, congestion, sore throat, rhinorrhea, sneezing, mouth sores, trouble swallowing, neck pain, neck stiffness and tinnitus.   ?Respiratory: Denies SOB, DOE, cough, chest tightness,  and wheezing.   ?Cardiovascular: Denies chest pain, palpitations and leg swelling.  ?Gastrointestinal: Denies nausea, vomiting, abdominal pain, diarrhea, constipation, blood in stool and abdominal distention.  ?Genitourinary: Denies dysuria, urgency, frequency, hematuria, flank pain and difficulty urinating.  ?Endocrine: Denies: hot or cold intolerance, sweats, changes in hair or nails, polyuria, polydipsia. ?Musculoskeletal: Denies myalgias, back pain, joint swelling, arthralgias and gait problem.  ?Skin: Denies pallor, rash and wound.  ?Neurological: Denies dizziness, seizures, syncope, weakness, light-headedness, numbness and headaches.  ?Hematological: Denies adenopathy. Easy bruising, personal or family bleeding  history  ?Psychiatric/Behavioral: Denies  mood changes, confusion and agitation ? ? ?Past Medical History:  ?Diagnosis Date  ? ADHD   ? Anxiety   ? Depression   ? Narcolepsy   ? Narcolepsy   ? ? ?Past Surgical History:  ?Procedure Laterality Date  ? BREAST BIOPSY    ? CESAREAN SECTION    ? COCHLEAR IMPLANT    ? ? ?Family History  ?Problem Relation Age of Onset  ? Breast cancer Maternal Grandmother 89  ? Diabetes Father   ? Thyroid disease Brother   ? Stroke Paternal Grandmother   ? Thyroid disease Maternal Aunt   ? ? ?SOCIAL HX:  ? reports that she has never smoked. She has never used smokeless tobacco. She reports current alcohol use. She reports that she does not use drugs. ? ? ?Current Outpatient Medications:  ?  fexofenadine (ALLEGRA) 180 MG tablet, Take by mouth., Disp: , Rfl:  ?  fluocinonide ointment (LIDEX) 0.05 %, Apply topically 2 (two) times daily., Disp: , Rfl:  ?  levonorgestrel (MIRENA) 20 MCG/24HR IUD, 1 each by Intrauterine route once., Disp: , Rfl:  ?  Magnesium 250 MG TABS, Take by mouth., Disp: , Rfl:  ?  melatonin 3 MG TABS tablet, Take 3 mg by mouth at bedtime., Disp: , Rfl:  ?  naproxen (NAPROSYN) 500 MG tablet, Take 500 mg by mouth 2 (two) times daily with a meal., Disp: , Rfl:  ?  OVER THE COUNTER MEDICATION, Smarty pants PHD - womens '100mg'$ , Disp: , Rfl:  ?  pseudoephedrine (SUDAFED) 30 MG tablet, Take 30 mg by mouth every 4 (four) hours as needed for congestion., Disp: , Rfl:  ?  valACYclovir (VALTREX) 1000 MG tablet, Take by mouth., Disp: , Rfl:  ?  Vitamin D, Ergocalciferol, (DRISDOL) 1.25 MG (50000 UNIT) CAPS capsule,  TAKE 1 CAPSULE BY MOUTH EVERY 7 DAYS FOR 12 DOSES, Disp: 12 capsule, Rfl: 0 ? ?EXAM:  ? ?VITALS per patient if applicable: None reported ? ?GENERAL: alert, oriented, appears well and in no acute distress ? ?HEENT: atraumatic, conjunttiva clear, no obvious abnormalities on inspection of external nose and ears ? ?NECK: normal movements of the head and neck ? ?LUNGS: on  inspection no signs of respiratory distress, breathing rate appears normal, no obvious gross increased work of breathing, gasping or wheezing ? ?CV: no obvious cyanosis ? ?MS: moves all visible extremities without noticeable abnormality ? ?PSYCH/NEURO: Affect appears flat, difficulty making eye contact. ? ?ASSESSMENT AND PLAN: ? ? ?Depression, recurrent ([place]) ? ?-Forms have been filled out and will be faxed. ? ?Time spent 21 minutes collecting information from patient and filling out requested forms. ?  ?I discussed the assessment and treatment plan with the patient. The patient was provided an opportunity to ask questions and all were answered. The patient agreed with the plan and demonstrated an understanding of the instructions. ?  ?The patient was advised to call back or seek an in-person evaluation if the symptoms worsen or if the condition fails to improve as anticipated. ? ? ? ?Lelon Frohlich, MD  ?[facility] ? ?

## 2021-10-11 NOTE — Telephone Encounter (Signed)
Pt requesting a doctors note stating she had an appointment for work. Pt had a virtual appointment. Pt can reached [phone] ?

## 2021-10-11 NOTE — Telephone Encounter (Signed)
Appointment scheduled.

## 2021-10-12 NOTE — Telephone Encounter (Signed)
Note sent via Mychart.

## 2021-10-15 ENCOUNTER — Encounter: Payer: Self-pay | Admitting: Internal Medicine

## 2021-10-17 NOTE — Telephone Encounter (Signed)
Pt calling in regarding forms, wants to know if they are ready for pick up. Requests callback [phone] ?

## 2021-10-17 NOTE — Telephone Encounter (Signed)
Pt calling in regarding forms, wants to know if they are ready for pick up. Requests callback [phone] ?

## 2021-10-31 ENCOUNTER — Telehealth (INDEPENDENT_AMBULATORY_CARE_PROVIDER_SITE_OTHER): Payer: BLUE CROSS/BLUE SHIELD | Admitting: Internal Medicine

## 2021-10-31 VITALS — Wt 171.0 lb

## 2021-10-31 DIAGNOSIS — M25552 Pain in left hip: Secondary | ICD-10-CM

## 2021-10-31 DIAGNOSIS — B001 Herpesviral vesicular dermatitis: Secondary | ICD-10-CM

## 2021-10-31 DIAGNOSIS — E559 Vitamin D deficiency, unspecified: Secondary | ICD-10-CM

## 2021-10-31 MED ORDER — NAPROXEN 500 MG PO TABS: 500.0000 mg | ORAL_TABLET | Freq: Two times a day (BID) | ORAL | 3 refills | Status: AC

## 2021-10-31 MED ORDER — VALACYCLOVIR HCL 1 G PO TABS: 1000.0000 mg | ORAL_TABLET | Freq: Every day | ORAL | 1 refills | Status: DC

## 2021-10-31 NOTE — Progress Notes (Signed)
? ? ?Virtual Visit via Video Note ? ?I connected with [name] on 10/31/21 at 11:30 AM EDT by a video enabled telemedicine application and verified that I am speaking with the correct person using two identifiers. ? ?Location patient: home ?Location provider: work office ?Persons participating in the virtual visit: patient, provider ? ?I discussed the limitations of evaluation and management by telemedicine and the availability of in person appointments. The patient expressed understanding and agreed to proceed. ? ? ?HPI: ?She has scheduled this visit to discuss some medication refills and also requesting an orthopedic referral. ? ?1.  She has occasional cold sores and is requesting a refill of valacyclovir that she takes for this. ? ?2.  She takes chronic naproxen for occasional joint pains and would like a refill. ? ?3.  She would like to discuss whether she needs to continue vitamin D supplementation.  She was given a 12-week course of high-dose vitamin D supplementation and repeat vitamin D's were within normal range, albeit low normal. ? ?4.  She has been having left-sided hip pain intermittently for about a year.  She is requesting orthopedics referral. ? ? ?ROS: ?Constitutional: Denies fever, chills, diaphoresis, appetite change and fatigue.  ?HEENT: Denies photophobia, eye pain, redness, hearing loss, ear pain, congestion, sore throat, rhinorrhea, sneezing, mouth sores, trouble swallowing, neck pain, neck stiffness and tinnitus.   ?Respiratory: Denies SOB, DOE, cough, chest tightness,  and wheezing.   ?Cardiovascular: Denies chest pain, palpitations and leg swelling.  ?Gastrointestinal: Denies nausea, vomiting, abdominal pain, diarrhea, constipation, blood in stool and abdominal distention.  ?Genitourinary: Denies dysuria, urgency, frequency, hematuria, flank pain and difficulty urinating.  ?Endocrine: Denies: hot or cold intolerance, sweats, changes in hair or nails, polyuria,  polydipsia. ?Musculoskeletal: Denies myalgias,  joint swelling. ?Skin: Denies pallor, rash and wound.  ?Neurological: Denies dizziness, seizures, syncope, weakness, light-headedness, numbness and headaches.  ?Hematological: Denies adenopathy. Easy bruising, personal or family bleeding history  ? ? ?Past Medical History:  ?Diagnosis Date  ? ADHD   ? Anxiety   ? Depression   ? Narcolepsy   ? Narcolepsy   ? ? ?Past Surgical History:  ?Procedure Laterality Date  ? BREAST BIOPSY    ? CESAREAN SECTION    ? COCHLEAR IMPLANT    ? ? ?Family History  ?Problem Relation Age of Onset  ? Breast cancer Maternal Grandmother 24  ? Diabetes Father   ? Thyroid disease Brother   ? Stroke Paternal Grandmother   ? Thyroid disease Maternal Aunt   ? ? ?SOCIAL HX:  ? reports that she has never smoked. She has never used smokeless tobacco. She reports current alcohol use. She reports that she does not use drugs. ? ? ?Current Outpatient Medications:  ?  fexofenadine (ALLEGRA) 180 MG tablet, Take by mouth., Disp: , Rfl:  ?  fluocinonide ointment (LIDEX) 0.05 %, Apply topically 2 (two) times daily., Disp: , Rfl:  ?  levonorgestrel (MIRENA) 20 MCG/24HR IUD, 1 each by Intrauterine route once., Disp: , Rfl:  ?  Magnesium 250 MG TABS, Take by mouth., Disp: , Rfl:  ?  melatonin 3 MG TABS tablet, Take 3 mg by mouth at bedtime., Disp: , Rfl:  ?  OVER THE COUNTER MEDICATION, Smarty pants PHD - womens '100mg'$ , Disp: , Rfl:  ?  pseudoephedrine (SUDAFED) 30 MG tablet, Take 30 mg by mouth every 4 (four) hours as needed for congestion., Disp: , Rfl:  ?  Vitamin D, Ergocalciferol, (DRISDOL) 1.25 MG (50000 UNIT) CAPS  capsule, TAKE 1 CAPSULE BY MOUTH EVERY 7 DAYS FOR 12 DOSES, Disp: 12 capsule, Rfl: 0 ?  naproxen (NAPROSYN) 500 MG tablet, Take 1 tablet (500 mg total) by mouth 2 (two) times daily with a meal., Disp: 60 tablet, Rfl: 3 ?  valACYclovir (VALTREX) 1000 MG tablet, Take 1 tablet (1,000 mg total) by mouth daily., Disp: 90 tablet, Rfl: 1 ? ?EXAM:   ? ?VITALS per patient if applicable: None reported ? ?GENERAL: alert, oriented, appears well and in no acute distress ? ?HEENT: atraumatic, conjunttiva clear, no obvious abnormalities on inspection of external nose and ears ? ?NECK: normal movements of the head and neck ? ?LUNGS: on inspection no signs of respiratory distress, breathing rate appears normal, no obvious gross increased work of breathing, gasping or wheezing ? ?CV: no obvious cyanosis ? ?MS: moves all visible extremities without noticeable abnormality ? ?PSYCH/NEURO: pleasant and cooperative, no obvious depression or anxiety, speech and thought processing grossly intact ? ?ASSESSMENT AND PLAN: ? ? ?Vitamin D deficiency ?-Advised OTC vitamin D 2000 to 5000 IUs daily. ? ?Left hip pain  ?- Plan: naproxen (NAPROSYN) 500 MG tablet, Ambulatory referral to Orthopedic Surgery ? ?Recurrent cold sores  ?- Plan: valACYclovir (VALTREX) 1000 MG tablet ? ? ?  ?I discussed the assessment and treatment plan with the patient. The patient was provided an opportunity to ask questions and all were answered. The patient agreed with the plan and demonstrated an understanding of the instructions. ?  ?The patient was advised to call back or seek an in-person evaluation if the symptoms worsen or if the condition fails to improve as anticipated. ? ? ? ?Lelon Frohlich, MD  ?[facility] ? ?

## 2021-11-09 ENCOUNTER — Telehealth: Payer: Self-pay | Admitting: Neurology

## 2021-11-09 NOTE — Telephone Encounter (Signed)
LVM for pt call back to schedule Initial CPAP appt.

## 2021-12-11 ENCOUNTER — Encounter (HOSPITAL_BASED_OUTPATIENT_CLINIC_OR_DEPARTMENT_OTHER): Payer: Self-pay | Admitting: Emergency Medicine

## 2021-12-11 ENCOUNTER — Emergency Department (HOSPITAL_BASED_OUTPATIENT_CLINIC_OR_DEPARTMENT_OTHER)
Admission: EM | Admit: 2021-12-11 | Discharge: 2021-12-11 | Disposition: A | Discharge status: Home / Self Care | Payer: BLUE CROSS/BLUE SHIELD | Attending: Emergency Medicine | Admitting: Emergency Medicine

## 2021-12-11 ENCOUNTER — Emergency Department (HOSPITAL_BASED_OUTPATIENT_CLINIC_OR_DEPARTMENT_OTHER): Payer: BLUE CROSS/BLUE SHIELD

## 2021-12-11 ENCOUNTER — Other Ambulatory Visit: Payer: Self-pay

## 2021-12-11 DIAGNOSIS — X58XXXA Exposure to other specified factors, initial encounter: Secondary | ICD-10-CM | POA: Diagnosis not present

## 2021-12-11 DIAGNOSIS — R079 Chest pain, unspecified: Secondary | ICD-10-CM | POA: Diagnosis not present

## 2021-12-11 DIAGNOSIS — S29012A Strain of muscle and tendon of back wall of thorax, initial encounter: Secondary | ICD-10-CM | POA: Diagnosis not present

## 2021-12-11 DIAGNOSIS — Z91040 Latex allergy status: Secondary | ICD-10-CM | POA: Diagnosis not present

## 2021-12-11 DIAGNOSIS — T148XXA Other injury of unspecified body region, initial encounter: Secondary | ICD-10-CM

## 2021-12-11 DIAGNOSIS — S299XXA Unspecified injury of thorax, initial encounter: Secondary | ICD-10-CM | POA: Diagnosis present

## 2021-12-11 LAB — BASIC METABOLIC PANEL
Anion gap: 5 (ref 5–15)
BUN: 16 mg/dL (ref 6–20)
CO2: 27 mmol/L (ref 22–32)
Calcium: 9.5 mg/dL (ref 8.9–10.3)
Chloride: 106 mmol/L (ref 98–111)
Creatinine, Ser: 0.83 mg/dL (ref 0.44–1.00)
GFR, Estimated: >60 mL/min (ref >60)
Glucose, Bld: 98 mg/dL (ref 70–99)
Potassium: 3.6 mmol/L (ref 3.5–5.1)
Sodium: 138 mmol/L (ref 135–145)

## 2021-12-11 LAB — CBC
HCT: 40.4 % (ref 36.0–46.0)
Hemoglobin: 13.7 g/dL (ref 12.0–15.0)
MCH: 30.2 pg (ref 26.0–34.0)
MCHC: 33.9 g/dL (ref 30.0–36.0)
MCV: 89 fL (ref 80.0–100.0)
Platelets: 225 10*3/uL (ref 150–400)
RBC: 4.54 MIL/uL (ref 3.87–5.11)
RDW: 13.1 % (ref 11.5–15.5)
WBC: 6.7 10*3/uL (ref 4.0–10.5)
nRBC: 0 % (ref 0.0–0.2)

## 2021-12-11 LAB — TROPONIN I (HIGH SENSITIVITY): Troponin I (High Sensitivity): 5 ng/L (ref <18)

## 2021-12-11 MED ORDER — LIDOCAINE 5 % EX PTCH
1.0000 | MEDICATED_PATCH | CUTANEOUS | Status: DC
  Administered 2021-12-11: 1 via TRANSDERMAL
  Filled 2021-12-11: qty 1

## 2021-12-11 MED ORDER — ACETAMINOPHEN 500 MG PO TABS
1000.0000 mg | ORAL_TABLET | Freq: Once | ORAL | Status: AC
  Administered 2021-12-11: 1000 mg via ORAL
  Filled 2021-12-11: qty 2

## 2021-12-11 MED ORDER — METHOCARBAMOL 500 MG PO TABS
1000.0000 mg | ORAL_TABLET | Freq: Once | ORAL | Status: AC
  Administered 2021-12-11: 1000 mg via ORAL
  Filled 2021-12-11: qty 2

## 2021-12-11 MED ORDER — METHOCARBAMOL 500 MG PO TABS: 1000.0000 mg | ORAL_TABLET | Freq: Two times a day (BID) | ORAL | 0 refills | Status: AC

## 2021-12-11 MED ORDER — ACETAMINOPHEN 500 MG PO TABS: 500.0000 mg | ORAL_TABLET | Freq: Four times a day (QID) | ORAL | 0 refills | Status: AC | PRN

## 2021-12-11 MED ORDER — LIDOCAINE 5 % EX PTCH
1.0000 | MEDICATED_PATCH | CUTANEOUS | 0 refills | Status: AC
Start: 2021-12-11 — End: ?

## 2021-12-11 NOTE — ED Notes (Signed)
Spoke with Okey Regal in lab to cancel d-dimer

## 2021-12-11 NOTE — ED Provider Notes (Signed)
[facility] Provider Note   CSN: [id number] Arrival date & time: 12/11/21  1357     History  Chief Complaint  Patient presents with   Chest Pain   Back Pain    Deanna Mcdonald is a 51 y.o. female with a past medical history of anxiety, ADHD, reported narcolepsy, and self-reported DVT presenting today with back pain.  Says that she has had pain under her right shoulder blade for the past week and a half.  She has tried naproxen, Tylenol and Voltaren without relief.  Says she has had "a little" chest pain.  No history of ACS.  Denies any new movements or exercises.   Chest Pain Associated symptoms: back pain   Back Pain Associated symptoms: chest pain        Home Medications Prior to Admission medications   Medication Sig Start Date End Date Taking? Authorizing Provider  fexofenadine (ALLEGRA) 180 MG tablet Take by mouth.    [provider]  fluocinonide ointment (LIDEX) 0.05 % Apply topically 2 (two) times daily. 07/04/21   [provider]  levonorgestrel (MIRENA) 20 MCG/24HR IUD 1 each by Intrauterine route once.    [provider]  Magnesium 250 MG TABS Take by mouth.    [provider]  melatonin 3 MG TABS tablet Take 3 mg by mouth at bedtime.    [provider]  naproxen (NAPROSYN) 500 MG tablet Take 1 tablet (500 mg total) by mouth 2 (two) times daily with a meal. 10/31/21   Isaac Bliss, Rayford Halsted, MD  OVER THE COUNTER MEDICATION Smarty pants PHD - womens '100mg'$     [provider]  pseudoephedrine (SUDAFED) 30 MG tablet Take 30 mg by mouth every 4 (four) hours as needed for congestion.    [provider]  valACYclovir (VALTREX) 1000 MG tablet Take 1 tablet (1,000 mg total) by mouth daily. 10/31/21   Isaac Bliss, Rayford Halsted, MD  Vitamin D, Ergocalciferol, (DRISDOL) 1.25 MG (50000 UNIT) CAPS capsule TAKE 1 CAPSULE BY MOUTH EVERY 7 DAYS FOR 12 DOSES 07/21/20   Isaac Bliss,  Rayford Halsted, MD  gabapentin (NEURONTIN) 300 MG capsule  01/15/18 11/27/19  [provider]  lisdexamfetamine (VYVANSE) 10 MG capsule Take 10 mg by mouth daily.  11/27/19  [provider]  traZODone (DESYREL) 100 MG tablet TK 1/2 TO 1 T PO A COUPLE H B BED 12/04/17 02/26/20  [provider]  venlafaxine (EFFEXOR) 37.5 MG tablet TAKE 1 TABLET(37.5 MG) BY MOUTH TWICE DAILY 02/24/20 02/26/20  Merian Capron, MD      Allergies    Zolpidem, Bupropion, Ibuprofen, Latex, Eggs or egg-derived products, and Prednisone    Review of Systems   Review of Systems  Cardiovascular:  Positive for chest pain.  Musculoskeletal:  Positive for back pain.    Physical Exam Updated Vital Signs BP 139/88 (BP Location: Left Arm)   Pulse 78   Temp 98.1 F (36.7 C) (Oral)   Resp 18   Ht '5\' 2"'$  (1.575 m)   Wt 77.1 kg   SpO2 100%   BMI 31.09 kg/m  Physical Exam Vitals and nursing note reviewed.  Constitutional:      General: She is not in acute distress.    Appearance: Normal appearance. She is not ill-appearing.  HENT:     Head: Normocephalic and atraumatic.  Eyes:     General: No scleral icterus.    Conjunctiva/sclera: Conjunctivae normal.  Cardiovascular:     Rate  and Rhythm: Normal rate and regular rhythm.     Heart sounds: Normal heart sounds.  Pulmonary:     Effort: Pulmonary effort is normal. No respiratory distress.     Breath sounds: Normal breath sounds. No decreased breath sounds.  Chest:     Chest wall: No tenderness.  Abdominal:     Palpations: Abdomen is soft.  Musculoskeletal:     Right lower leg: No edema.     Left lower leg: No edema.     Comments: Full range of motion of right upper extremity.  Positive empty can.  Pain also worse with shoulder flexion.  Strong radial pulse.  Skin:    General: Skin is warm and dry.     Findings: No rash.  Neurological:     Mental Status: She is alert.  Psychiatric:        Mood and Affect: Mood normal.     ED Results /  Procedures / Treatments   Labs (all labs ordered are listed, but only abnormal results are displayed) Labs Reviewed  BASIC METABOLIC PANEL  CBC  TROPONIN I (HIGH SENSITIVITY)    EKG EKG Interpretation  Date/Time:  Sunday December 11 2021 14:09:07 EDT Ventricular Rate:  75 PR Interval:    QRS Duration: 93 QT Interval:  377 QTC Calculation: 421 R Axis:   76 Text Interpretation: sinus rhythm ST elev, probable normal early repol pattern No significant change since last tracing Confirmed by Dorie Rank [phone]) on 12/11/2021 3:16:30 PM  Radiology DG Chest 2 View  Result Date: 12/11/2021 CLINICAL DATA:  Chest pain, shortness of breath, and lightheadedness for 1 week. EXAM: CHEST - 2 VIEW COMPARISON:  05/17/2012 FINDINGS: The heart size and mediastinal contours are within normal limits. Both lungs are clear. The visualized skeletal structures are unremarkable. IMPRESSION: No active cardiopulmonary disease. Electronically Signed   By: Marlaine Hind M.D.   On: 12/11/2021 14:45    Procedures Procedures   Medications Ordered in ED Medications  lidocaine (LIDODERM) 5 % 1 patch (1 patch Transdermal Patch Applied 12/11/21 1440)  methocarbamol (ROBAXIN) tablet 1,000 mg (1,000 mg Oral Given 12/11/21 1440)  acetaminophen (TYLENOL) tablet 1,000 mg (1,000 mg Oral Given 12/11/21 1439)    ED Course/ Medical Decision Making/ A&P Clinical Course as of 12/11/21 1529  Sun Dec 11, 2021  1455 DG Chest 2 View [MR]    Clinical Course User Index [MR] Jaquesha Boroff, Cecilio Asper, PA-C                           Medical Decision Making Amount and/or Complexity of Data Reviewed Labs: ordered. Radiology: ordered. Decision-making details documented in ED Course.  Risk OTC drugs. Prescription drug management.   This patient presents to the ED for concern of back and chest, shoulder pain and some dizziness.  Differential includes but is not limited to aortic dissection, PE, ACS, shoulder injury, cholecystitis, CVA.    This is not an exhaustive differential.    Past Medical History / Co-morbidities / Social History: Non-smoker, no history of hypertension, diabetes or concerning familial cardiac history.   Additional history: I reviewed patient's chart from 2021.  She says that she had a pulmonary embolism then.  She had said that she was not on blood thinners and this prompted me to investigate further.  It appears that she had concern for DVT and a D-dimer was ordered at that time.  It was slightly high so a lower extremity ultrasound  was pursued.  This was negative.  No reported history of PE.  At this time I suspect she misunderstood the results of a high D-dimer.  EDP documented no signs of DVT/PE.  Unable to apply PERC criteria due to patient's age however she does fall into low likelihood Wells PE score.  Will discontinue dimer that was ordered in triage.  Patient agrees with this plan.   Physical Exam: Right shoulder pain worse with palpation of the scapula as well as flexion.  Lab Tests: I ordered, and personally interpreted labs.  No pertinent findings    Imaging Studies: I ordered and independently visualized and interpreted chest x-ray which showed no abnormalities. I agree with the radiologist interpretation.   Cardiac Monitoring:  The patient was maintained on a cardiac monitor.  NSR no premature complexes or runs of arrhythmias.    Medications: I ordered medication including lidocaine patch, Tylenol and Robaxin. Reevaluation of the patient after these medicines showed that the patient resolved. I have reviewed the patients home medicines and have made adjustments as needed.   Disposition: 52 year old female presenting with multiple complaints.  She has had 1-1/2 weeks worth of pain under her right shoulder blade.  Also occasional chest pain.  Worse at rest she states.  Physical exam with tenderness to palpation over the right scapula.  Also worse with shoulder flexion, positive empty  can.  Cardiac work-up negative.  Considered admission for further work-up however her heart score 1.  No second troponin ordered.  Low likelihood Wells score and patient does not have a confirmed history of DVT/PE.  We engaged in shared decision-making over D-dimer and she understands that she will require CT imaging if her D-dimer is high.  She agrees not to pursue this at this time.  With the complaint of chest pain and back pain, dissection had to be considered.  Vital signs are stable, was not complaining of severe pain and was not hypotensive.  She was well-appearing and has no history of aortic aneurysm or hypertension.  Low suspicion dissection. More suspicion musculoskeletal etiology.  She was given lidocaine patches, Tylenol and Robaxin.  On reevaluation she said that she feels a lot better.  After consideration of the diagnostic results and the patients response to treatment, I feel that she is stable for discharge home with follow-up with PCP.  She is agreeable to this.  I told her she did not have to see her PCP immediately but if it does not get better in the next week that is the best resource to follow-up with.  She is agreeable.   I discussed this case with my attending physician Dr.   Tomi Bamberger who cosigned this note including patient's presenting symptoms, physical exam, and planned diagnostics and interventions. Attending physician stated agreement with plan or made changes to plan which were implemented.     Final Clinical Impression(s) / ED Diagnoses Final diagnoses:  Muscle strain    Rx / DC Orders ED Discharge Orders          Ordered    lidocaine (LIDODERM) 5 %  Every 24 hours        12/11/21 1520    methocarbamol (ROBAXIN) 500 MG tablet  2 times daily        12/11/21 1520    acetaminophen (TYLENOL) 500 MG tablet  Every 6 hours PRN        12/11/21 1520           Results and diagnoses were explained to  the patient. Return precautions discussed in full. Patient had no  additional questions and expressed complete understanding.   This chart was dictated using voice recognition software.  Despite best efforts to proofread,  errors can occur which can change the documentation meaning.     Darliss Ridgel 12/11/21 1536    Dorie Rank, MD 12/14/21 [phone]

## 2021-12-11 NOTE — ED Triage Notes (Signed)
Pt c/o upper back back and chest pain x 1.5 weeks. Also endorses shob, lightheadedness. Hx of PE in 2021.

## 2021-12-13 ENCOUNTER — Telehealth: Payer: Self-pay | Admitting: Internal Medicine

## 2021-12-14 ENCOUNTER — Encounter: Payer: Self-pay | Admitting: Adult Health

## 2021-12-15 ENCOUNTER — Ambulatory Visit (INDEPENDENT_AMBULATORY_CARE_PROVIDER_SITE_OTHER): Payer: BLUE CROSS/BLUE SHIELD | Admitting: Internal Medicine

## 2021-12-15 ENCOUNTER — Encounter: Payer: Self-pay | Admitting: Internal Medicine

## 2021-12-15 VITALS — BP 120/80 | HR 75 | Temp 98.3°F | Wt 173.9 lb

## 2021-12-15 DIAGNOSIS — M549 Dorsalgia, unspecified: Secondary | ICD-10-CM | POA: Diagnosis not present

## 2021-12-15 NOTE — Progress Notes (Signed)
Established Patient Office Visit     CC/Reason for Visit: Upper back pain  HPI: Deanna Mcdonald is a 51 y.o. female who is coming in today for the above mentioned reasons.  She has been having upper back pain under her right shoulder blade for about a week.  She believes it is related to her sitting position at work.  She went to the ED on 6/25 and had a negative work-up.  She is requesting a note from work to allow her to have more frequent breaks for stretching.  Past Medical/Surgical History: Past Medical History:  Diagnosis Date   ADHD    Anxiety    Depression    Narcolepsy    Narcolepsy     Past Surgical History:  Procedure Laterality Date   BREAST BIOPSY     CESAREAN SECTION     COCHLEAR IMPLANT      Social History:  reports that she has never smoked. She has never used smokeless tobacco. She reports current alcohol use. She reports that she does not use drugs.  Allergies: Allergies  Allergen Reactions   Zolpidem     Thoughts of suicide Thoughts of suicide    Bupropion Nausea Only    Ringing in ears, chest tightness   Ibuprofen Hives   Latex Hives   Eggs Or Egg-Derived Products Nausea And Vomiting   Prednisone Anxiety    Family History:  Family History  Problem Relation Age of Onset   Breast cancer Maternal Grandmother 73   Diabetes Father    Thyroid disease Brother    Stroke Paternal Grandmother    Thyroid disease Maternal Aunt      Current Outpatient Medications:    acetaminophen (TYLENOL) 500 MG tablet, Take 1 tablet (500 mg total) by mouth every 6 (six) hours as needed., Disp: 30 tablet, Rfl: 0   fexofenadine (ALLEGRA) 180 MG tablet, Take by mouth., Disp: , Rfl:    fluocinonide ointment (LIDEX) 0.05 %, Apply topically 2 (two) times daily., Disp: , Rfl:    levonorgestrel (MIRENA) 20 MCG/24HR IUD, 1 each by Intrauterine route once., Disp: , Rfl:    lidocaine (LIDODERM) 5 %, Place 1 patch onto the skin daily. Remove & Discard patch within 12  hours or as directed by MD, Disp: 30 patch, Rfl: 0   Magnesium 250 MG TABS, Take by mouth., Disp: , Rfl:    melatonin 3 MG TABS tablet, Take 3 mg by mouth at bedtime., Disp: , Rfl:    methocarbamol (ROBAXIN) 500 MG tablet, Take 2 tablets (1,000 mg total) by mouth 2 (two) times daily., Disp: 20 tablet, Rfl: 0   naproxen (NAPROSYN) 500 MG tablet, Take 1 tablet (500 mg total) by mouth 2 (two) times daily with a meal., Disp: 60 tablet, Rfl: 3   OVER THE COUNTER MEDICATION, Smarty pants PHD - womens '100mg'$ , Disp: , Rfl:    pseudoephedrine (SUDAFED) 30 MG tablet, Take 30 mg by mouth every 4 (four) hours as needed for congestion., Disp: , Rfl:    valACYclovir (VALTREX) 1000 MG tablet, Take 1 tablet (1,000 mg total) by mouth daily., Disp: 90 tablet, Rfl: 1  Review of Systems:  Constitutional: Denies fever, chills, diaphoresis, appetite change and fatigue.  HEENT: Denies photophobia, eye pain, redness, hearing loss, ear pain, congestion, sore throat, rhinorrhea, sneezing, mouth sores, trouble swallowing, neck pain, neck stiffness and tinnitus.   Respiratory: Denies SOB, DOE, cough, chest tightness,  and wheezing.   Cardiovascular: Denies chest pain, palpitations  and leg swelling.  Gastrointestinal: Denies nausea, vomiting, abdominal pain, diarrhea, constipation, blood in stool and abdominal distention.  Genitourinary: Denies dysuria, urgency, frequency, hematuria, flank pain and difficulty urinating.  Endocrine: Denies: hot or cold intolerance, sweats, changes in hair or nails, polyuria, polydipsia. Musculoskeletal: Denies  joint swelling, arthralgias and gait problem.  Skin: Denies pallor, rash and wound.  Neurological: Denies dizziness, seizures, syncope, weakness, light-headedness, numbness and headaches.  Hematological: Denies adenopathy. Easy bruising, personal or family bleeding history  Psychiatric/Behavioral: Denies suicidal ideation, mood changes, confusion, nervousness, sleep disturbance and  agitation    Physical Exam: Vitals:   12/15/21 1259  BP: 120/80  Pulse: 75  Temp: 98.3 F (36.8 C)  TempSrc: Oral  SpO2: 98%  Weight: 173 lb 14.4 oz (78.9 kg)    Body mass index is 31.81 kg/m.   Constitutional: NAD, calm, comfortable Eyes: PERRL, lids and conjunctivae normal ENMT: Mucous membranes are moist..  Musculoskeletal: Pain to palpation of right upper back around shoulder blade area.  Psychiatric: Normal judgment and insight. Alert and oriented x 3. Normal mood.    Impression and Plan:  Upper back pain on right side -Advised icing alternating with heat, local massage therapy, as needed NSAIDs, back stretches.  If no improvement can consider PT. -Note for work written.   Time spent:23 minutes reviewing chart, interviewing and examining patient and formulating plan of care.    Lelon Frohlich, MD [facility]

## 2021-12-16 ENCOUNTER — Encounter: Payer: Self-pay | Admitting: Internal Medicine

## 2021-12-19 ENCOUNTER — Ambulatory Visit: Payer: BLUE CROSS/BLUE SHIELD | Admitting: Internal Medicine

## 2021-12-19 MED ORDER — NAPROXEN 500 MG PO TABS: 500.0000 mg | ORAL_TABLET | Freq: Two times a day (BID) | ORAL | 0 refills | Status: AC | PRN

## 2021-12-28 ENCOUNTER — Telehealth: Payer: Self-pay | Admitting: Neurology

## 2021-12-28 ENCOUNTER — Encounter: Payer: Self-pay | Admitting: Internal Medicine

## 2021-12-28 DIAGNOSIS — B001 Herpesviral vesicular dermatitis: Secondary | ICD-10-CM

## 2021-12-28 MED ORDER — VALACYCLOVIR HCL 1 G PO TABS: 1000.0000 mg | ORAL_TABLET | Freq: Two times a day (BID) | ORAL | 0 refills | Status: AC

## 2021-12-28 NOTE — Telephone Encounter (Signed)
..   Pt understands that although there may be some limitations with this type of visit, we will take all precautions to reduce any security or privacy concerns.  Pt understands that this will be treated like an in office visit and we will file with pt's insurance, and there may be a patient responsible charge related to this service. ? ?

## 2021-12-28 NOTE — Addendum Note (Signed)
Addended by: Westley Hummer B on: 12/28/2021 10:48 AM   Modules accepted: Orders

## 2021-12-29 ENCOUNTER — Encounter: Payer: Self-pay | Admitting: Adult Health

## 2022-01-05 ENCOUNTER — Other Ambulatory Visit: Payer: Self-pay | Admitting: Family Medicine

## 2022-01-12 ENCOUNTER — Ambulatory Visit (INDEPENDENT_AMBULATORY_CARE_PROVIDER_SITE_OTHER): Payer: BLUE CROSS/BLUE SHIELD | Admitting: Internal Medicine

## 2022-01-12 VITALS — BP 120/80

## 2022-01-12 DIAGNOSIS — G8929 Other chronic pain: Secondary | ICD-10-CM

## 2022-01-12 DIAGNOSIS — M549 Dorsalgia, unspecified: Secondary | ICD-10-CM | POA: Diagnosis not present

## 2022-01-12 NOTE — Progress Notes (Signed)
Established Patient Office Visit     CC/Reason for Visit: Requesting work accommodation forms  HPI: Deanna Mcdonald is a 51 y.o. female who is coming in today for the above mentioned reasons.  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  She brings in work accommodation forms today.  She cannot fully explain what they are for, but does tell me that she is wanting more time for bathroom breaks and stretching.  She tells me that she has to clock out and back in for her breaks and she recently received an email that she had exceeded her personal time allowance.  Past Medical/Surgical History: Past Medical History:  Diagnosis Date   ADHD    Anxiety    Depression    Narcolepsy    Narcolepsy     Past Surgical History:  Procedure Laterality Date   BREAST BIOPSY     CESAREAN SECTION     COCHLEAR IMPLANT      Social History:  reports that she has never smoked. She has never used smokeless tobacco. She reports current alcohol use. She reports that she does not use drugs.  Allergies: Allergies  Allergen Reactions   Zolpidem     Thoughts of suicide Thoughts of suicide    Bupropion Nausea Only    Ringing in ears, chest tightness   Ibuprofen Hives   Latex Hives   Eggs Or Egg-Derived Products Nausea And Vomiting   Prednisone Anxiety    Family History:  Family History  Problem Relation Age of Onset   Breast cancer Maternal Grandmother 70   Diabetes Father    Thyroid disease Brother    Stroke Paternal Grandmother    Thyroid disease Maternal Aunt      Current Outpatient Medications:    acetaminophen (TYLENOL) 500 MG tablet, Take 1 tablet (500 mg total) by mouth every 6 (six) hours as needed., Disp: 30 tablet, Rfl: 0   fexofenadine (ALLEGRA) 180 MG tablet, Take by mouth., Disp: , Rfl:    fluocinonide ointment (LIDEX) 0.05 %, Apply topically 2 (two) times daily., Disp: , Rfl:    levonorgestrel (MIRENA) 20 MCG/24HR IUD, 1 each by Intrauterine route  once., Disp: , Rfl:    lidocaine (LIDODERM) 5 %, Place 1 patch onto the skin daily. Remove & Discard patch within 12 hours or as directed by MD, Disp: 30 patch, Rfl: 0   Magnesium 250 MG TABS, Take by mouth., Disp: , Rfl:    melatonin 3 MG TABS tablet, Take 3 mg by mouth at bedtime., Disp: , Rfl:    methocarbamol (ROBAXIN) 500 MG tablet, Take 2 tablets (1,000 mg total) by mouth 2 (two) times daily., Disp: 20 tablet, Rfl: 0   naproxen (NAPROSYN) 500 MG tablet, Take 1 tablet (500 mg total) by mouth 2 (two) times daily with a meal., Disp: 60 tablet, Rfl: 3   naproxen (NAPROSYN) 500 MG tablet, Take 1 tablet (500 mg total) by mouth 2 (two) times daily as needed., Disp: 30 tablet, Rfl: 0   OVER THE COUNTER MEDICATION, Smarty pants PHD - womens '100mg'$ , Disp: , Rfl:    pseudoephedrine (SUDAFED) 30 MG tablet, Take 30 mg by mouth every 4 (four) hours as needed for congestion., Disp: , Rfl:    valACYclovir (VALTREX) 1000 MG tablet, Take 1 tablet (1,000 mg total) by mouth 2 (two) times daily., Disp: 10 tablet, Rfl: 0  Review of Systems:  Constitutional: Denies fever, chills, diaphoresis, appetite change and  fatigue.  HEENT: Denies photophobia, eye pain, redness, hearing loss, ear pain, congestion, sore throat, rhinorrhea, sneezing, mouth sores, trouble swallowing, neck pain, neck stiffness and tinnitus.   Respiratory: Denies SOB, DOE, cough, chest tightness,  and wheezing.   Cardiovascular: Denies chest pain, palpitations and leg swelling.  Gastrointestinal: Denies nausea, vomiting, abdominal pain, diarrhea, constipation, blood in stool and abdominal distention.  Genitourinary: Denies dysuria, urgency, frequency, hematuria, flank pain and difficulty urinating.  Endocrine: Denies: hot or cold intolerance, sweats, changes in hair or nails, polyuria, polydipsia. Musculoskeletal: Denies myalgias, back pain, joint swelling, arthralgias and gait problem.  Skin: Denies pallor, rash and wound.  Neurological: Denies  dizziness, seizures, syncope, weakness, light-headedness, numbness and headaches.  Hematological: Denies adenopathy. Easy bruising, personal or family bleeding history  Psychiatric/Behavioral: Denies suicidal ideation, confusion, nervousness, sleep disturbance and agitation    Physical Exam: Vitals:   01/12/22 1006  BP: 120/80    There is no height or weight on file to calculate BMI.   Constitutional: NAD, calm, comfortable Eyes: PERRL, lids and conjunctivae normal, wears corrective lenses  Psychiatric: Mood is upset and tearful   Impression and Plan:  Chronic bilateral back pain, unspecified back location -I have explained to her that I am unable to fill out work accommodation forms at this time.  In order to do so I need to understand what her job duties are as well as an explanation as to what her personal time allowances are and what she is requesting in addition to that, and she is not able to provide this at this time.  I have asked her to contact her HR department to provide that information and we can go from there. -She seems very frustrated with this answer, is tearful and upset.  States:  'I need this job to pay my bills".  Have explained to her that in order to be fair to both her and her employer I will need the information described above.   Time spent:21 minutes reviewing chart, interviewing and examining patient and formulating plan of care.     Lelon Frohlich, MD [facility]

## 2022-01-16 ENCOUNTER — Encounter: Payer: Self-pay | Admitting: Internal Medicine

## 2022-01-25 NOTE — Progress Notes (Unsigned)
Virtual Visit via Video Note  I connected with Deanna Mcdonald on 01/25/22 at  8:45 AM EDT by a video enabled telemedicine application and verified that I am speaking with the correct person using two identifiers.  Location: Patient: at her home Provider: in the office    I discussed the limitations of evaluation and management by telemedicine and the availability of in person appointments. The patient expressed understanding and agreed to proceed.  History of Present Illness: Copied Dr. Rexene Alberts 10/21/20: Dear Dr. Isaac Bliss,   I saw your patient, Deanna Mcdonald, upon your kind request in the sleep clinic today for consultation of her sleep disorder, in particular, concern for underlying obstructive sleep apnea and a possible prior diagnosis of narcolepsy.  Patient is unaccompanied today.  As you know, Ms. Deanna Mcdonald is a 51 year old right-handed woman with an underlying medical history of ADHD, anxiety, depression, and borderline obesity, who reports worsening snoring and excessive daytime somnolence.  She was previously diagnosed with narcolepsy in 2008.  As she recalls, she only had an overnight sleep study, not a daytime sleep study however.  She reports seeing Dr. Tonia Ghent at the time.  Prior sleep study results are not available for my review today.  She is willing to sign a release of information form.  She reports that she tried different medications for narcolepsy including Xyrem which caused cognitive side effects and no visual, which also caused side effects.  She has been on Vyvanse but stopped it last year.  She has been on other medications including gabapentin, trazodone, and Effexor but stopped her medications within the last year.  She is seeing psychiatry and is supposed to start Lamictal.  She reports weight gain in the realm of 10 or 15 pounds in the past year.  She reports that her snoring is disturbing her and wakes her up.  She has had palpitations at times.  She has woken up with a  headache at times.  She takes over-the-counter Aleve or prescription naproxen depending on whether she also has significant knee pain at the time.  She is a non-smoker.  She does not drink any alcohol regularly, only occasionally, she does not drink caffeine daily.  She is not aware of any family history of sleep apnea.  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  She goes to bed around 10 and it takes her about 30 minutes to fall asleep, rise time is around 6.  She is divorced, she has a total of 5 children, 1 daughter lives with her at this time.  She reports that she has a whole binder of prior records including medications that she has tried but she did not bring any records to this appointment.   Addendum: Of note, I was able to review an office visit note from 05/17/2016.  Patient saw Dr. Tonia Ghent at the time.  According to the office note, she had a PSG on 12/10/2010 which showed an AHI of 0.5/h with no desaturations, no snoring noted.  She had an MSLT with a mean sleep latency of 1.9 minutes for 4 naps with REM and at least every nap reported.  Medication list at the time included trazodone, Adderall XR 10 mg twice daily, Ativan 0.5 mg daily, written on 10 mg twice daily, Nuvigil 250 mg daily, Ambien 10 mg as needed.   She also had a consultation with Thomos Lemons, PA at [facility] on 11/25/2018 for insomnia.  She was referred to behavioral  health at the time.  Update January 26, 2022 SS: had HST 08/01/21 showed mild OSA, recommended AutoPap.  Set up date 11/01/21 with [facility].  See download below, shows perfect compliance at 100% of the last 30 days.  AHI is well treated at 1.5, very minimal leak 95th percentile 6.2.  Using nasal mask.  Feels less tired, has more energy since starting CPAP.  Is sleeping well.  No issues with machine.  Has noted some irritation to her nasal bridge from the mask.  Started new job at the [employer].       Observations/Objective: Via virtual visit is alert and  oriented, speech is clear and concise, facial symmetry noted, excellent historian.  Assessment and Plan: 1.  OSA, on CPAP -Download shows superb compliance, commended on this, encouraged to continue nightly use greater than 4 hours, has clear benefit -I will order for continued supplies, encouraged to get a new mask, if the nasal irritation does not improve, may need mask refit  Follow Up Instructions: 1 year 01/30/2023 3:15   I discussed the assessment and treatment plan with the patient. The patient was provided an opportunity to ask questions and all were answered. The patient agreed with the plan and demonstrated an understanding of the instructions.   The patient was advised to call back or seek an in-person evaluation if the symptoms worsen or if the condition fails to improve as anticipated.   Evangeline Dakin, DNP  [facility] [address] [place], [place] [address] [phone]

## 2022-01-26 ENCOUNTER — Encounter: Payer: Self-pay | Admitting: Neurology

## 2022-01-26 ENCOUNTER — Telehealth: Payer: BLUE CROSS/BLUE SHIELD | Admitting: Neurology

## 2022-01-26 ENCOUNTER — Encounter: Payer: Self-pay | Admitting: Adult Health

## 2022-01-26 DIAGNOSIS — Z9989 Dependence on other enabling machines and devices: Secondary | ICD-10-CM | POA: Diagnosis not present

## 2022-01-26 DIAGNOSIS — G4733 Obstructive sleep apnea (adult) (pediatric): Secondary | ICD-10-CM

## 2022-01-26 NOTE — Patient Instructions (Signed)
Great to meet you!  I will send an order over for continued supplies, if we change the mask out it is still irritating her nose, let me know, I will order a mask refit Continue excellent use of CPAP! I will see back in 1 year

## 2022-02-07 ENCOUNTER — Telehealth: Payer: Self-pay | Admitting: Internal Medicine

## 2022-02-07 NOTE — Telephone Encounter (Signed)
Responded via MyChart.

## 2022-02-07 NOTE — Telephone Encounter (Signed)
Pt called to inform CMA that she uploaded the form to [place].   Pt stated CMA knows what form.  Please call her back if you need further information.

## 2022-02-09 ENCOUNTER — Other Ambulatory Visit: Payer: Self-pay | Admitting: Adult Health

## 2022-02-09 DIAGNOSIS — B001 Herpesviral vesicular dermatitis: Secondary | ICD-10-CM

## 2023-01-30 ENCOUNTER — Telehealth: Payer: Self-pay | Admitting: Neurology

## 2023-08-13 NOTE — Telephone Encounter (Signed)
 Copied from CRM [id number]. Topic: Appointments - Transfer of Care >> Aug 13, 2023 12:20 PM Truddie Crumble wrote: Pt is requesting to transfer FROM: MD Philip Aspen Pt is requesting to transfer TO: DO Florina Ou Reason for requested transfer: closer to her home It is the responsibility of the team the patient would like to transfer to (Dr. Florina Ou) to reach out to the patient if for any reason this transfer is not acceptable. CB [phone]

## 2023-08-14 ENCOUNTER — Telehealth: Payer: Self-pay | Admitting: Internal Medicine

## 2023-08-14 NOTE — Telephone Encounter (Signed)
 Lvm about transferring care to [facility] per pt request.

## 2023-08-29 ENCOUNTER — Ambulatory Visit (INDEPENDENT_AMBULATORY_CARE_PROVIDER_SITE_OTHER): Payer: BLUE CROSS/BLUE SHIELD | Admitting: Obstetrics & Gynecology

## 2023-08-29 ENCOUNTER — Other Ambulatory Visit (HOSPITAL_COMMUNITY)
Admission: RE | Admit: 2023-08-29 | Discharge: 2023-08-29 | Disposition: A | Discharge status: Home / Self Care | Source: Ambulatory Visit | Attending: Obstetrics & Gynecology | Admitting: Obstetrics & Gynecology

## 2023-08-29 VITALS — BP 134/74 | HR 84 | Ht 62.0 in | Wt 189.0 lb

## 2023-08-29 DIAGNOSIS — Z1231 Encounter for screening mammogram for malignant neoplasm of breast: Secondary | ICD-10-CM

## 2023-08-29 DIAGNOSIS — Z01419 Encounter for gynecological examination (general) (routine) without abnormal findings: Secondary | ICD-10-CM

## 2023-08-29 DIAGNOSIS — Z113 Encounter for screening for infections with a predominantly sexual mode of transmission: Secondary | ICD-10-CM | POA: Insufficient documentation

## 2023-08-29 DIAGNOSIS — Z1211 Encounter for screening for malignant neoplasm of colon: Secondary | ICD-10-CM | POA: Diagnosis not present

## 2023-08-29 NOTE — Progress Notes (Signed)
 Subjective:     Deanna Mcdonald is a 53 y.o. female here for a routine exam.  Current complaints: Pt wanted LnIUD changed as she thought it was expired. Her Mirena IUD was placed 05/2018. She denies bleeding or other GYN sx related to the IUD. Pt reports that she was given a cream for a rash that also treated STIs. She is worried about STIs and would like to be screened. She reports that she was last sexually active in 2014.   She reports that she was on a med prior to Effexor and when she was on it, her 'bladder' issues resolved. Pt reports that she goes to the bathroom after so fear of having to go. She has leaked 3 times but, feels that she will go before she leaves the house, on breaks and often throughout the day. She doesn't go because she feels the need to void but, for concerns that she might need to go. Often, when she goes to the BR, nothing comes out. She does not have urge sx.       Gynecologic History No LMP recorded. (Menstrual status: IUD). Contraception: abstinence Last Pap: 06/04/2020. Results were: normal Last mammogram: 05/22/2020. Results were: normal  Obstetric History OB History  Gravida Para Term Preterm AB Living  8 5 5  0 3 5  SAB IAB Ectopic Multiple Live Births  2 1 0 0 0    # Outcome Date GA Lbr Len/2nd Weight Sex Type Anes PTL Lv  8 IAB           7 SAB           6 SAB           5 Term           4 Term           3 Term           2 Term           1 Term              The following portions of the patient's history were reviewed and updated as appropriate: allergies, current medications, past family history, past medical history, past social history, past surgical history, and problem list.  Review of Systems Pertinent items are noted in HPI.    Objective:  BP 134/74   Pulse 84   Ht 5\' 2"  (1.575 m)   Wt 189 lb (85.7 kg)   BMI 34.57 kg/m   General Appearance:    Alert, cooperative, no distress, appears stated age  Head:    Normocephalic, without  obvious abnormality, atraumatic  Eyes:    conjunctiva/corneas clear, EOM's intact, both eyes  Ears:    Normal external ear canals, both ears  Nose:   Nares normal, septum midline, mucosa normal, no drainage    or sinus tenderness  Throat:   Lips, mucosa, and tongue normal; teeth and gums normal  Neck:   Supple, symmetrical, trachea midline, no adenopathy;    thyroid:  no enlargement/tenderness/nodules  Back:     Symmetric, no curvature, ROM normal, no CVA tenderness  Lungs:     respirations unlabored  Chest Wall:    No tenderness or deformity   Heart:    Regular rate and rhythm  Breast Exam:    No tenderness, masses, or nipple abnormality  Abdomen:     Soft, non-tender, bowel sounds active all four quadrants,    no masses, no organomegaly  Genitalia:  Normal female without lesion, discharge or tenderness   IUD strings noted.   Extremities:   Extremities normal, atraumatic, no cyanosis or edema  Pulses:   2+ and symmetric all extremities  Skin:   Skin color, texture, turgor normal, no rashes or lesions     Assessment:    Healthy female exam.  Urinary sx- after prolonged discussion, it does not appear to be OAB rather anxiety over urinary sx. Pt aligns with discussing this with her psychiatrist initially as on her prev meds her sx resolved. If that is not successful, pt will f/u and we will consider tx for OAB.   IUD- reviewed the current exp of the Mirena IUD. By the time of expiration (up to 8 years), would rec that pt not have replaced due to menopausal age at that time.  Pt aware of expiration. All questions answered.    Plan:  Teara was seen today for gynecologic exam.  Diagnoses and all orders for this visit:  Well female exam with routine gynecological exam -     Cytology - PAP  Colon cancer screening -     Ambulatory referral to Gastroenterology  Breast cancer screening by mammogram -     MM 3D SCREENING MAMMOGRAM BILATERAL BREAST; Future  Screening examination for  STI -     Cervicovaginal ancillary only   F/u in 1 year os sooner prn   Lianette Broussard L. Harraway-Smith, M.D., [place]

## 2023-08-31 ENCOUNTER — Encounter: Payer: Self-pay | Admitting: Obstetrics & Gynecology

## 2023-08-31 LAB — CERVICOVAGINAL ANCILLARY ONLY
Bacterial Vaginitis (gardnerella): NEGATIVE
Candida Glabrata: NEGATIVE
Candida Vaginitis: NEGATIVE
Chlamydia: NEGATIVE
Comment: NEGATIVE
Comment: NEGATIVE
Comment: NEGATIVE
Comment: NEGATIVE
Comment: NEGATIVE
Comment: NORMAL
Neisseria Gonorrhea: NEGATIVE
Trichomonas: NEGATIVE

## 2023-09-03 LAB — CYTOLOGY - PAP
Chlamydia: NEGATIVE
Comment: NEGATIVE
Comment: NEGATIVE
Comment: NORMAL
Diagnosis: NEGATIVE
High risk HPV: NEGATIVE
Neisseria Gonorrhea: NEGATIVE

## 2023-09-13 ENCOUNTER — Encounter (HOSPITAL_BASED_OUTPATIENT_CLINIC_OR_DEPARTMENT_OTHER): Payer: Self-pay

## 2023-09-13 ENCOUNTER — Ambulatory Visit (HOSPITAL_BASED_OUTPATIENT_CLINIC_OR_DEPARTMENT_OTHER)
Admission: RE | Admit: 2023-09-13 | Discharge: 2023-09-13 | Disposition: A | Discharge status: Home / Self Care | Source: Ambulatory Visit | Attending: Obstetrics & Gynecology | Admitting: Obstetrics & Gynecology

## 2023-09-13 DIAGNOSIS — Z1231 Encounter for screening mammogram for malignant neoplasm of breast: Secondary | ICD-10-CM | POA: Diagnosis present

## 2023-10-25 ENCOUNTER — Encounter (HOSPITAL_COMMUNITY): Payer: Self-pay

## 2023-11-01 ENCOUNTER — Encounter: Payer: BLUE CROSS/BLUE SHIELD | Admitting: Family Medicine

## 2023-11-16 ENCOUNTER — Encounter: Payer: Self-pay | Admitting: Obstetrics & Gynecology

## 2024-02-07 ENCOUNTER — Ambulatory Visit: Payer: Self-pay | Admitting: Neurology
